# Patient Record
Sex: Male | Born: 1985 | Race: Black or African American | Hispanic: No | Marital: Single | Smoking: Current every day smoker
Health system: Southern US, Community
[De-identification: ages and names within clinical notes are randomized; demographics above are authoritative.]

---

## 2004-01-30 ENCOUNTER — Emergency Department: Payer: Self-pay | Admitting: Unknown Physician Specialty

## 2005-02-16 ENCOUNTER — Emergency Department (HOSPITAL_COMMUNITY): Admission: EM | Admit: 2005-02-16 | Discharge: 2005-02-16 | Payer: Self-pay | Admitting: Emergency Medicine

## 2005-03-02 ENCOUNTER — Emergency Department (HOSPITAL_COMMUNITY): Admission: EM | Admit: 2005-03-02 | Discharge: 2005-03-02 | Payer: Self-pay | Admitting: Emergency Medicine

## 2005-03-04 ENCOUNTER — Emergency Department (HOSPITAL_COMMUNITY): Admission: EM | Admit: 2005-03-04 | Discharge: 2005-03-04 | Payer: Self-pay | Admitting: Family Medicine

## 2009-10-03 ENCOUNTER — Emergency Department (HOSPITAL_COMMUNITY): Admission: EM | Admit: 2009-10-03 | Discharge: 2009-10-03 | Payer: Self-pay | Admitting: Emergency Medicine

## 2010-03-18 LAB — URINALYSIS, ROUTINE W REFLEX MICROSCOPIC
Bilirubin Urine: NEGATIVE
Ketones, ur: NEGATIVE mg/dL
Nitrite: NEGATIVE
Urobilinogen, UA: 1 mg/dL (ref 0.0–1.0)

## 2010-03-18 LAB — COMPREHENSIVE METABOLIC PANEL
AST: 25 U/L (ref 0–37)
Albumin: 3.6 g/dL (ref 3.5–5.2)
CO2: 25 mEq/L (ref 19–32)
Calcium: 8.9 mg/dL (ref 8.4–10.5)
Creatinine, Ser: 0.86 mg/dL (ref 0.4–1.5)
GFR calc Af Amer: >60 mL/min (ref >60)
GFR calc non Af Amer: >60 mL/min (ref >60)

## 2010-03-18 LAB — CBC
MCH: 28.3 pg (ref 26.0–34.0)
MCHC: 33.7 g/dL (ref 30.0–36.0)
Platelets: 282 10*3/uL (ref 150–400)

## 2010-03-18 LAB — LIPASE, BLOOD: Lipase: 26 U/L (ref 11–59)

## 2010-03-18 LAB — DIFFERENTIAL
Eosinophils Relative: 2 % (ref 0–5)
Lymphocytes Relative: 28 % (ref 12–46)
Lymphs Abs: 1.4 10*3/uL (ref 0.7–4.0)

## 2012-06-16 ENCOUNTER — Encounter (HOSPITAL_COMMUNITY): Payer: Self-pay | Admitting: *Deleted

## 2012-06-16 ENCOUNTER — Emergency Department (INDEPENDENT_AMBULATORY_CARE_PROVIDER_SITE_OTHER)
Admission: EM | Admit: 2012-06-16 | Discharge: 2012-06-16 | Disposition: A | Discharge status: Home / Self Care | Payer: Self-pay | Source: Home / Self Care | Attending: Emergency Medicine | Admitting: Emergency Medicine

## 2012-06-16 DIAGNOSIS — A691 Other Vincent's infections: Secondary | ICD-10-CM

## 2012-06-16 MED ORDER — METRONIDAZOLE 500 MG PO TABS: 500.0000 mg | ORAL_TABLET | Freq: Three times a day (TID) | ORAL | Status: DC

## 2012-06-16 MED ORDER — HYDROCODONE-ACETAMINOPHEN 5-325 MG PO TABS: ORAL_TABLET | ORAL | Status: DC

## 2012-06-16 MED ORDER — CHLORHEXIDINE GLUCONATE 0.12 % MT SOLN: OROMUCOSAL | Status: DC

## 2012-06-16 MED ORDER — PENICILLIN V POTASSIUM 500 MG PO TABS: 500.0000 mg | ORAL_TABLET | Freq: Three times a day (TID) | ORAL | Status: DC

## 2012-06-16 NOTE — ED Notes (Signed)
PT  REPORTS  SWELLING  R  SIDE  FACE  -  NEAR     R  LOWER  CHEEK  AREA   IN  PROXIMTY  TO  SALIVARY  GLANDS       DENYS  ANY  TOOTHACHE     DENYS  ANY  SORETHROAT OR PAIN ON  SWALLOWING -  AIRWAY  INTACT        IN NO ACUTE  DISTRESS

## 2012-06-16 NOTE — ED Provider Notes (Signed)
Chief Complaint:   Chief Complaint  Patient presents with  . Facial Swelling    History of Present Illness:   Richard Tate is a 27 year old male who has had a five-day history of fever and pain in the right side of the face, localized to the cheek with swelling of the cheek and he denies any dental pain, however he does have pain with chewing and biting. He denies any sore throat or difficulty breathing or swallowing. He's had no cervical lymphadenopathy but does have some pain in his neck. He's had no coughing, wheezing, chest pain, or shortness of breath. He denies any nasal congestion or rhinorrhea.  Review of Systems:  Other than noted above, the patient denies any of the following symptoms: Systemic:  No fevers, chills, sweats, weight loss or gain, fatigue, or tiredness. Eye:  No redness, pain, discharge, itching, blurred vision, or diplopia. ENT:  No headache, nasal congestion, sneezing, itching, epistaxis, ear pain, congestion, decreased hearing, ringing in ears, vertigo, or tinnitus.  No oral lesions, sore throat, pain on swallowing, or hoarseness. Neck:  No mass, tenderness or adenopathy. Lungs:  No coughing, wheezing, or shortness of breath. Skin:  No rash or itching.  PMFSH:  Past medical history, family history, social history, meds, and allergies were reviewed.   Physical Exam:   Vital signs:  BP 129/84  Pulse 90  Temp(Src) 98.2 F (36.8 C) (Oral)  Resp 18  SpO2 100% General:  Alert and oriented.  In no distress.  Skin warm and dry. Eye:  PERRL, full EOMs, lids and conjunctiva normal.   ENT:  TMs and canals clear.  Nasal mucosa not congested and without drainage.  Mucous membranes moist, he has widespread inflammation and swelling of the gingiva or purulent drainage coming from around the teeth. This involves both upper and lower dentition. He has multiple carious teeth and widespread dental decay. There is no swelling of the floor the mouth. The pharynx is clear the airway  is widely patent and he does have some swelling of the cheek but there is no swelling of the parotid gland, or parotid mass. There is no cervical lymphadenopathy. He does have a little tenderness in the anterior cervical triangle on the right. Neck:  Supple, full ROM.  No adenopathy, tenderness or mass.  Thyroid normal. Lungs:  Breath sounds clear and equal bilaterally.  No wheezes, rales or rhonchi. Heart:  Rhythm regular, without extrasystoles.  No gallops or murmers. Skin:  Clear, warm and dry.  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  He has severe gingivitis. He will need to see a dentist. I recommended good dental hygiene, and he was given prescriptions for Peridex, hydrocodone for the pain, Flagyl, and penicillin.  Plan:   1.  The following meds were prescribed:   Discharge Medication List as of 06/16/2012  2:51 PM    START taking these medications   Details  chlorhexidine (PERIDEX) 0.12 % solution 15 mL BID, swish and spit out, Normal    HYDROcodone-acetaminophen (NORCO/VICODIN) 5-325 MG per tablet 1 to 2 tabs every 4 to 6 hours as needed for pain., Print    metroNIDAZOLE (FLAGYL) 500 MG tablet Take 1 tablet (500 mg total) by mouth 3 (three) times daily., Starting 06/16/2012, Until Discontinued, Normal    penicillin v potassium (VEETID) 500 MG tablet Take 1 tablet (500 mg total) by mouth 3 (three) times daily., Starting 06/16/2012, Until Discontinued, Normal       2.  The patient was instructed  in symptomatic care and handouts were given. 3.  The patient was told to return if becoming worse in any way, if no better in 3 or 4 days, and given some red flag symptoms such as difficulty swallowing or breathing that would indicate earlier return. 4.  Follow up with our dentist on call today as soon as possible.    Reuben Likes, MD 06/16/12 [phone]

## 2012-10-29 ENCOUNTER — Emergency Department (HOSPITAL_COMMUNITY): Payer: Self-pay

## 2012-10-29 ENCOUNTER — Encounter (HOSPITAL_COMMUNITY): Payer: Self-pay | Admitting: Emergency Medicine

## 2012-10-29 ENCOUNTER — Emergency Department (HOSPITAL_COMMUNITY)
Admission: EM | Admit: 2012-10-29 | Discharge: 2012-10-29 | Disposition: A | Discharge status: Home / Self Care | Payer: Self-pay | Attending: Emergency Medicine | Admitting: Emergency Medicine

## 2012-10-29 DIAGNOSIS — F17200 Nicotine dependence, unspecified, uncomplicated: Secondary | ICD-10-CM | POA: Insufficient documentation

## 2012-10-29 DIAGNOSIS — S0181XA Laceration without foreign body of other part of head, initial encounter: Secondary | ICD-10-CM

## 2012-10-29 DIAGNOSIS — S0990XA Unspecified injury of head, initial encounter: Secondary | ICD-10-CM

## 2012-10-29 DIAGNOSIS — Z880 Allergy status to penicillin: Secondary | ICD-10-CM | POA: Insufficient documentation

## 2012-10-29 DIAGNOSIS — S0180XA Unspecified open wound of other part of head, initial encounter: Secondary | ICD-10-CM | POA: Insufficient documentation

## 2012-10-29 MED ORDER — TETANUS-DIPHTH-ACELL PERTUSSIS 5-2.5-18.5 LF-MCG/0.5 IM SUSP
0.5000 mL | Freq: Once | INTRAMUSCULAR | Status: AC
  Administered 2012-10-29: 0.5 mL via INTRAMUSCULAR
  Filled 2012-10-29: qty 0.5
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
## 2012-11-03 NOTE — ED Provider Notes (Signed)
Medical screening examination/treatment/procedure(s) were performed by non-physician practitioner and as supervising physician I was immediately available for consultation/collaboration.  EKG Interpretation   None         Lelan Cush J Swayze Kozuch, MD 11/03/12 1107 

## 2013-10-13 ENCOUNTER — Emergency Department (HOSPITAL_COMMUNITY)
Admission: EM | Admit: 2013-10-13 | Discharge: 2013-10-13 | Disposition: A | Discharge status: Home / Self Care | Payer: Self-pay | Attending: Emergency Medicine | Admitting: Emergency Medicine

## 2013-10-13 ENCOUNTER — Encounter (HOSPITAL_COMMUNITY): Payer: Self-pay | Admitting: Emergency Medicine

## 2013-10-13 ENCOUNTER — Emergency Department (HOSPITAL_COMMUNITY): Payer: Self-pay

## 2013-10-13 DIAGNOSIS — Z880 Allergy status to penicillin: Secondary | ICD-10-CM | POA: Insufficient documentation

## 2013-10-13 DIAGNOSIS — K0889 Other specified disorders of teeth and supporting structures: Secondary | ICD-10-CM

## 2013-10-13 DIAGNOSIS — K088 Other specified disorders of teeth and supporting structures: Secondary | ICD-10-CM | POA: Insufficient documentation

## 2013-10-13 DIAGNOSIS — K029 Dental caries, unspecified: Secondary | ICD-10-CM | POA: Insufficient documentation

## 2013-10-13 DIAGNOSIS — Z791 Long term (current) use of non-steroidal anti-inflammatories (NSAID): Secondary | ICD-10-CM | POA: Insufficient documentation

## 2013-10-13 DIAGNOSIS — Z792 Long term (current) use of antibiotics: Secondary | ICD-10-CM | POA: Insufficient documentation

## 2013-10-13 DIAGNOSIS — Z720 Tobacco use: Secondary | ICD-10-CM | POA: Insufficient documentation

## 2013-10-13 DIAGNOSIS — R220 Localized swelling, mass and lump, head: Secondary | ICD-10-CM

## 2013-10-13 LAB — CBC WITH DIFFERENTIAL/PLATELET
BASOS ABS: 0.1 10*3/uL (ref 0.0–0.1)
BASOS PCT: 0 % (ref 0–1)
EOS ABS: 0 10*3/uL (ref 0.0–0.7)
Eosinophils Relative: 0 % (ref 0–5)
HCT: 40.2 % (ref 39.0–52.0)
Hemoglobin: 13.9 g/dL (ref 13.0–17.0)
Lymphocytes Relative: 19 % (ref 12–46)
Lymphs Abs: 2.3 10*3/uL (ref 0.7–4.0)
MCH: 31.7 pg (ref 26.0–34.0)
MCHC: 34.6 g/dL (ref 30.0–36.0)
MCV: 91.8 fL (ref 78.0–100.0)
Monocytes Absolute: 1.1 10*3/uL — ABNORMAL HIGH (ref 0.1–1.0)
Monocytes Relative: 9 % (ref 3–12)
NEUTROS ABS: 8.9 10*3/uL — AB (ref 1.7–7.7)
NEUTROS PCT: 72 % (ref 43–77)
PLATELETS: 220 10*3/uL (ref 150–400)
RBC: 4.38 MIL/uL (ref 4.22–5.81)
RDW: 14.3 % (ref 11.5–15.5)
WBC: 12.4 10*3/uL — ABNORMAL HIGH (ref 4.0–10.5)

## 2013-10-13 LAB — I-STAT CHEM 8, ED
BUN: 4 mg/dL — ABNORMAL LOW (ref 6–23)
CREATININE: 0.9 mg/dL (ref 0.50–1.35)
Calcium, Ion: 1.1 mmol/L — ABNORMAL LOW (ref 1.12–1.23)
Chloride: 106 mEq/L (ref 96–112)
GLUCOSE: 68 mg/dL — AB (ref 70–99)
HEMATOCRIT: 46 % (ref 39.0–52.0)
HEMOGLOBIN: 15.6 g/dL (ref 13.0–17.0)
Potassium: 4.6 mEq/L (ref 3.7–5.3)
SODIUM: 142 meq/L (ref 137–147)
TCO2: 24 mmol/L (ref 0–100)

## 2013-10-13 MED ORDER — DEXAMETHASONE SODIUM PHOSPHATE 10 MG/ML IJ SOLN
10.0000 mg | Freq: Once | INTRAMUSCULAR | Status: AC
  Administered 2013-10-13: 10 mg via INTRAVENOUS
  Filled 2013-10-13: qty 1

## 2013-10-13 MED ORDER — IBUPROFEN 800 MG PO TABS: 800.0000 mg | ORAL_TABLET | Freq: Three times a day (TID) | ORAL | Status: DC

## 2013-10-13 MED ORDER — CLINDAMYCIN HCL 150 MG PO CAPS: 300.0000 mg | ORAL_CAPSULE | Freq: Three times a day (TID) | ORAL | Status: DC

## 2013-10-13 MED ORDER — CLINDAMYCIN PHOSPHATE 900 MG/50ML IV SOLN
900.0000 mg | Freq: Once | INTRAVENOUS | Status: AC
  Administered 2013-10-13: 900 mg via INTRAVENOUS
  Filled 2013-10-13: qty 50

## 2013-10-13 MED ORDER — MORPHINE SULFATE 4 MG/ML IJ SOLN
4.0000 mg | Freq: Once | INTRAMUSCULAR | Status: AC
  Administered 2013-10-13: 4 mg via INTRAVENOUS
  Filled 2013-10-13: qty 1

## 2013-10-13 MED ORDER — OXYCODONE-ACETAMINOPHEN 5-325 MG PO TABS: 1.0000 | ORAL_TABLET | ORAL | Status: DC | PRN

## 2013-10-13 NOTE — ED Provider Notes (Signed)
Medical screening examination/treatment/procedure(s) were performed by non-physician practitioner and as supervising physician I was immediately available for consultation/collaboration.   EKG Interpretation None        Gilda Creasehristopher J. Pollina, MD 10/13/13 1700

## 2013-10-13 NOTE — ED Provider Notes (Signed)
CSN: [id number]     Arrival date & time 10/13/13  1237 History   First MD Initiated Contact with Patient 10/13/13 1526     Chief Complaint  Patient presents with  . Dental Problem  . Facial Swelling     (Consider location/radiation/quality/duration/timing/severity/associated sxs/prior Treatment) HPI Comments: Patient is a 28 year old male presenting to the emergency department for right-sided facial swelling and right upper dental pain. Patient states it started several days ago he noticed some severe throbbing pain in the right upper tooth since then has had swelling. He denies any drainage from the tooth. He is attempted to take Motrin without any relief. He has attempted to follow up with a dentist but has been unable to get into see one yet. Denies any fevers, chills, nausea, vomiting, shortness of breath, difficulty swallowing, lip or tongue swelling, CP.    History reviewed. No pertinent past medical history. History reviewed. No pertinent past surgical history. No family history on file. History  Substance Use Topics  . Smoking status: Current Every Day Smoker  . Smokeless tobacco: Not on file  . Alcohol Use: Yes    Review of Systems  HENT: Positive for dental problem and facial swelling.   Respiratory: Negative for shortness of breath.   Cardiovascular: Negative for chest pain.  All other systems reviewed and are negative.     Allergies  Penicillins  Home Medications   Prior to Admission medications   Medication Sig Start Date End Date Taking? Authorizing Provider  clindamycin (CLEOCIN) 150 MG capsule Take 2 capsules (300 mg total) by mouth 3 (three) times daily. May dispense as 150mg  capsules 10/13/13   Khalessi Blough L Elva Mauro, PA-C  ibuprofen (ADVIL,MOTRIN) 800 MG tablet Take 1 tablet (800 mg total) by mouth 3 (three) times daily. 10/13/13   Brittannie Tawney L Kale Dols, PA-C  oxyCODONE-acetaminophen (PERCOCET/ROXICET) 5-325 MG per tablet Take 1 tablet by mouth every 4  (four) hours as needed for severe pain. May take 2 tablets PO q 6 hours for severe pain - Do not take with Tylenol as this tablet already contains tylenol 10/13/13   Marrissa Dai L Batsheva Stevick, PA-C   BP 131/79  Pulse 102  Resp 16  Ht 6\' 1"  (1.854 m)  Wt 153 lb (69.4 kg)  BMI 20.19 kg/m2  SpO2 98% Physical Exam  Nursing note and vitals reviewed. Constitutional: He is oriented to person, place, and time. He appears well-developed and well-nourished. No distress.  HENT:  Head: Normocephalic and atraumatic.    Right Ear: External ear normal.  Left Ear: External ear normal.  Nose: Nose normal.  Mouth/Throat: Uvula is midline and mucous membranes are normal. No trismus in the jaw. Abnormal dentition. Dental caries present. No dental abscesses or uvula swelling. No oropharyngeal exudate.    Submental and sublingual spaces are soft.  Eyes: Conjunctivae are normal.  Neck: Normal range of motion. Neck supple.  Cardiovascular: Normal rate, regular rhythm and normal heart sounds.   Pulmonary/Chest: Effort normal and breath sounds normal. No respiratory distress.  Lymphadenopathy:    He has no cervical adenopathy.  Neurological: He is alert and oriented to person, place, and time.  Skin: Skin is warm and dry. He is not diaphoretic.  Psychiatric: He has a normal mood and affect.    ED Course  Procedures (including critical care time) Medications  clindamycin (CLEOCIN) IVPB 900 mg (900 mg Intravenous New Bag/Given 10/13/13 1605)  morphine 4 MG/ML injection 4 mg (4 mg Intravenous Given 10/13/13 1604)  dexamethasone (DECADRON) injection 10  mg (10 mg Intravenous Given 10/13/13 1604)    Labs Review Labs Reviewed  CBC WITH DIFFERENTIAL - Abnormal; Notable for the following:    WBC 12.4 (*)    Neutro Abs 8.9 (*)    Monocytes Absolute 1.1 (*)    All other components within normal limits  I-STAT CHEM 8, ED - Abnormal; Notable for the following:    BUN 4 (*)    Glucose, Bld 68 (*)     Calcium, Ion 1.10 (*)    All other components within normal limits    Imaging Review No results found.   EKG Interpretation None      MDM   Final diagnoses:  Right facial swelling  Pain, dental    Filed Vitals:   10/13/13 1251  BP: 131/79  Pulse: 102  Resp: 16   Afebrile, NAD, non-toxic appearing, AAOx4. Patient will receive a CT scan for better evaluation of facial swelling as there is no obvious dental abscess on examination. No concern for Ludwig's angina. Labs reviewed. IV Clindamycin given. Patient signed out to Sharilyn SitesLisa Sanders, PA-C pending CT maxillofacial. Disposition pending lab results, if no drain able abscess or other complication anticipate discharge home with dental follow up. Patient d/w with Dr. Blinda LeatherwoodPollina, agrees with plan.     Jeannetta EllisJennifer L Neosha Switalski, PA-C 10/13/13 1654

## 2013-10-13 NOTE — Discharge Instructions (Signed)
Please follow up with your primary care physician in 1-2 days. If you do not have one please call the [facility] number listed above. Please follow up with Dr. Lucky CowboyKnox to schedule a follow up appointment.  Please take your antibiotic until completion. Please take pain medication and/or muscle relaxants as prescribed and as needed for pain. Please do not drive on narcotic pain medication or on muscle relaxants. Please read all discharge instructions and return precautions.   Dental Abscess A dental abscess is a collection of infected fluid (pus) from a bacterial infection in the inner part of the tooth (pulp). It usually occurs at the end of the tooth's root.  CAUSES   Severe tooth decay.  Trauma to the tooth that allows bacteria to enter into the pulp, such as a broken or chipped tooth. SYMPTOMS   Severe pain in and around the infected tooth.  Swelling and redness around the abscessed tooth or in the mouth or face.  Tenderness.  Pus drainage.  Bad breath.  Bitter taste in the mouth.  Difficulty swallowing.  Difficulty opening the mouth.  Nausea.  Vomiting.  Chills.  Swollen neck glands. DIAGNOSIS   A medical and dental history will be taken.  An examination will be performed by tapping on the abscessed tooth.  X-rays may be taken of the tooth to identify the abscess. TREATMENT The goal of treatment is to eliminate the infection. You may be prescribed antibiotic medicine to stop the infection from spreading. A root canal may be performed to save the tooth. If the tooth cannot be saved, it may be pulled (extracted) and the abscess may be drained.  HOME CARE INSTRUCTIONS  Only take over-the-counter or prescription medicines for pain, fever, or discomfort as directed by your caregiver.  Rinse your mouth (gargle) often with salt water ( tsp salt in 8 oz [250 ml] of warm water) to relieve pain or swelling.  Do not drive after taking pain medicine  (narcotics).  Do not apply heat to the outside of your face.  Return to your dentist for further treatment as directed. SEEK MEDICAL CARE IF:  Your pain is not helped by medicine.  Your pain is getting worse instead of better. SEEK IMMEDIATE MEDICAL CARE IF:  You have a fever or persistent symptoms for more than 2-3 days.  You have a fever and your symptoms suddenly get worse.  You have chills or a very bad headache.  You have problems breathing or swallowing.  You have trouble opening your mouth.  You have swelling in the neck or around the eye. Document Released: 12/20/2004 Document Revised: 09/14/2011 Document Reviewed: 03/30/2010 [facility] Patient Information 2015 [employer] [place]. This information is not intended to replace advice given to you by your health care provider. Make sure you discuss any questions you have with your health care provider.  RESOURCE GUIDE  If you do not have a primary care doctor to follow up with regarding today's visit, please call the [facility] at [phone] to make an appointment. Hours of operation are 10am - 7pm, Monday through Friday, and they have a sliding scale fee.    Dental Assistance Please contact the on-call dentist listed on your discharge papers WITHIN 48 HOURS.  If the on-call dentist agrees that your condition is emergent, there is a CHANCE (not a guarantee) that you MAY receive a savings on your visit at this point. If you wait more than 48 hours, it will not be  considered an emergency.   Drs. Moreen Fowleravid and Janna Civils:  [address], [place], [place], [address], [phone]  Short-notice availability  Tooth evaluation: $100  Emergency Treatment: $200 (including exam, xrays, tooth extraction, and post-op visit)  Patients with Medicaid: [facility] [phone], [phone] [address], [phone]  If unable to pay, or uninsured, contact [facility] [phone] in [place], [phone] in [place]) to become qualified for the adult dental clinic  Other Low-Cost Community Dental Services: - [facility]: [address], [place], [place], [address], [phone], [address]. [phone], 2nd and 4th Thursday of the month at 6:30am.  10 clients each day by appointment, can sometimes see walk-in patients if someone does not show for an appointment. [facility]:  [address], [place], [place], [address], [phone] [facility]:  [address], [place], [place], [address], [phone] [facility]:  [phone] [facility]:  [phone] [facility]:  [phone]

## 2013-10-13 NOTE — ED Notes (Signed)
Pt states he started having pain to a tooth on the upper right side a few days ago and had some swelling but it is much worse now.

## 2013-10-13 NOTE — ED Provider Notes (Signed)
Patient received in sign out from PA Piepenbrink at shift change.  28 y.o. M with suspected dental abscess, no drainable fluid collected noted on exam.  Patient continues handling secretions wells, talking without difficulty, and airway remains clear, however due to amount of swelling imaging will be obtained.  Plan:  Labs and CT max/face with contrast pending.  Likely discharge with clindamycin and pain meds as written by PA Piepenbrink.  Results for orders placed during the hospital encounter of 10/13/13  CBC WITH DIFFERENTIAL      Result Value Ref Range   WBC 12.4 (*) 4.0 - 10.5 K/uL   RBC 4.38  4.22 - 5.81 MIL/uL   Hemoglobin 13.9  13.0 - 17.0 g/dL   HCT 69.640.2  29.539.0 - 28.452.0 %   MCV 91.8  78.0 - 100.0 fL   MCH 31.7  26.0 - 34.0 pg   MCHC 34.6  30.0 - 36.0 g/dL   RDW 13.214.3  44.011.5 - 10.215.5 %   Platelets 220  150 - 400 K/uL   Neutrophils Relative % 72  43 - 77 %   Neutro Abs 8.9 (*) 1.7 - 7.7 K/uL   Lymphocytes Relative 19  12 - 46 %   Lymphs Abs 2.3  0.7 - 4.0 K/uL   Monocytes Relative 9  3 - 12 %   Monocytes Absolute 1.1 (*) 0.1 - 1.0 K/uL   Eosinophils Relative 0  0 - 5 %   Eosinophils Absolute 0.0  0.0 - 0.7 K/uL   Basophils Relative 0  0 - 1 %   Basophils Absolute 0.1  0.0 - 0.1 K/uL  I-STAT CHEM 8, ED      Result Value Ref Range   Sodium 142  137 - 147 mEq/L   Potassium 4.6  3.7 - 5.3 mEq/L   Chloride 106  96 - 112 mEq/L   BUN 4 (*) 6 - 23 mg/dL   Creatinine, Ser 7.250.90  0.50 - 1.35 mg/dL   Glucose, Bld 68 (*) 70 - 99 mg/dL   Calcium, Ion 3.661.10 (*) 1.12 - 1.23 mmol/L   TCO2 24  0 - 100 mmol/L   Hemoglobin 15.6  13.0 - 17.0 g/dL   HCT 44.046.0  34.739.0 - 42.552.0 %   Ct Maxillofacial Wo Cm  10/13/2013   CLINICAL DATA:  Facial pain and swelling which began a few days ago.  EXAM: CT MAXILLOFACIAL WITHOUT CONTRAST  TECHNIQUE: Multidetector CT imaging of the maxillofacial structures was performed without intravenous contrast. Multiplanar CT image reconstructions were also generated. A small  metallic BB was placed on the right temple in order to reliably differentiate right from left.  : COMPARISON:  Head CT 10/29/2012  FINDINGS: There is extensive dental disease with bilateral dental caries. The second left mandibular premolar tooth demonstrates periapical lucency and destructive bony changes. The lateral margin of the mandible is disrupted. The maxillary teeth demonstrate similar findings. On the left side there is a large cavity involving the first molar and significant periapical lucency and destructive bony change involving the first premolar tooth. On the right side there is a large cavity involving the second premolar tooth along with periapical lucency. There is also significant loss of bone between the second premolar and first molar tooth.  Although above study was done without contrast there appears to be a small abscess adjacent to the right mandible. This measures approximately 2.6 x 0.9 cm. There is also extensive subcutaneous soft tissue swelling/ edema involving the entire right side of  the face.  Extensive paranasal sinus disease with mucoperiosteal thickening involving both maxillary sinuses and scattered ethmoid air cells. The sphenoid sinus is clear and the mastoid air cells and middle ear cavities are clear. The frontal sinuses demonstrate minimal mucoperiosteal thickening. The globes are intact. The visualized portion of the brain is unremarkable.  IMPRESSION: Extensive dental disease with dental caries and periapical lucency. Suspect a small abscess on the right side adjacent to the mandible with extensive right-sided facial cellulitis.   Electronically Signed   By: Loralie ChampagneMark  Gallerani M.D.   On: 10/13/2013 18:01   Lab with leukocytosis, no significant electrolyte imbalance.  CT revealing dental abscess with overlying facial cellulitis.  On repeat evaluation, no drainable fluid collection.  Airway remains clear.  Patient will be discharged home with abx and pain meds as per PA  Piepenbrink.  FU with dentist.  Discussed plan with patient, he/she acknowledged understanding and agreed with plan of care.  Return precautions given for new or worsening symptoms.  Garlon HatchetLisa M Sanders, PA-C 10/13/13 2027

## 2013-10-13 NOTE — ED Notes (Signed)
Pt reports facial swelling and tooth pain that has been getting worse over 4 days. Pt states he has a tooth that has a chunk missing. Visible swelling to rt side of face. Pt denies any trouble breathing. Rates pain 9/10.

## 2013-10-14 NOTE — ED Provider Notes (Signed)
Medical screening examination/treatment/procedure(s) were performed by non-physician practitioner and as supervising physician I was immediately available for consultation/collaboration.   EKG Interpretation None       Doug SouSam Mekiah Cambridge, MD 10/14/13 [phone]

## 2014-01-09 ENCOUNTER — Emergency Department (HOSPITAL_COMMUNITY)
Admission: EM | Admit: 2014-01-09 | Discharge: 2014-01-09 | Disposition: A | Discharge status: Home / Self Care | Payer: Self-pay | Attending: Emergency Medicine | Admitting: Emergency Medicine

## 2014-01-09 ENCOUNTER — Encounter (HOSPITAL_COMMUNITY): Payer: Self-pay | Admitting: Cardiology

## 2014-01-09 DIAGNOSIS — Z720 Tobacco use: Secondary | ICD-10-CM | POA: Insufficient documentation

## 2014-01-09 DIAGNOSIS — N390 Urinary tract infection, site not specified: Secondary | ICD-10-CM | POA: Insufficient documentation

## 2014-01-09 DIAGNOSIS — Z880 Allergy status to penicillin: Secondary | ICD-10-CM | POA: Insufficient documentation

## 2014-01-09 DIAGNOSIS — Z792 Long term (current) use of antibiotics: Secondary | ICD-10-CM | POA: Insufficient documentation

## 2014-01-09 DIAGNOSIS — Z791 Long term (current) use of non-steroidal anti-inflammatories (NSAID): Secondary | ICD-10-CM | POA: Insufficient documentation

## 2014-01-09 LAB — URINALYSIS, ROUTINE W REFLEX MICROSCOPIC
Glucose, UA: NEGATIVE mg/dL
Ketones, ur: 15 mg/dL — AB
LEUKOCYTES UA: NEGATIVE
NITRITE: POSITIVE — AB
PH: 7 (ref 5.0–8.0)
Protein, ur: >300 mg/dL — AB
SPECIFIC GRAVITY, URINE: 1.025 (ref 1.005–1.030)
UROBILINOGEN UA: 4 mg/dL — AB (ref 0.0–1.0)

## 2014-01-09 LAB — URINE MICROSCOPIC-ADD ON

## 2014-01-09 MED ORDER — PHENAZOPYRIDINE HCL 200 MG PO TABS
200.0000 mg | ORAL_TABLET | Freq: Three times a day (TID) | ORAL | Status: AC
Start: 2014-01-09 — End: ?

## 2014-01-09 MED ORDER — CEPHALEXIN 500 MG PO CAPS: 500.0000 mg | ORAL_CAPSULE | Freq: Four times a day (QID) | ORAL | Status: DC

## 2014-01-09 MED ORDER — SODIUM CHLORIDE 0.9 % IV BOLUS (SEPSIS): 1000.0000 mL | Freq: Once | INTRAVENOUS | Status: DC

## 2014-01-09 MED ORDER — PHENAZOPYRIDINE HCL 100 MG PO TABS
200.0000 mg | ORAL_TABLET | Freq: Once | ORAL | Status: AC
  Administered 2014-01-09: 200 mg via ORAL
  Filled 2014-01-09: qty 2

## 2014-01-09 MED ORDER — CEPHALEXIN 250 MG PO CAPS
500.0000 mg | ORAL_CAPSULE | Freq: Once | ORAL | Status: AC
  Administered 2014-01-09: 500 mg via ORAL
  Filled 2014-01-09: qty 2

## 2014-01-09 NOTE — ED Provider Notes (Signed)
CSN: [id number]     Arrival date & time 01/09/14  1005 History   First MD Initiated Contact with Patient 01/09/14 1045     Chief Complaint  Patient presents with  . Dysuria   (Consider location/radiation/quality/duration/timing/severity/associated sxs/prior Treatment) HPI Richard Tate is a 29 yo male presenting with dysuria x 2 days.  He reports he has burning in his poenis when he urinates and then suprapubic pain afterward. He states he has been avoiding drinking because of the pain he has when he urinates, so he feels thirsty.  He reports some mild discomfort in his back bilat but denies any nausea, vomiting, fever, chills, penile discharge or scrotal pain.       History reviewed. No pertinent past medical history. History reviewed. No pertinent past surgical history. History reviewed. No pertinent family history. History  Substance Use Topics  . Smoking status: Current Every Day Smoker  . Smokeless tobacco: Not on file  . Alcohol Use: Yes    Review of Systems  Constitutional: Negative for fever and chills.  HENT: Negative for sore throat.   Eyes: Negative for visual disturbance.  Respiratory: Negative for cough and shortness of breath.   Cardiovascular: Negative for chest pain and leg swelling.  Gastrointestinal: Positive for abdominal pain. Negative for nausea, vomiting and diarrhea.  Genitourinary: Positive for dysuria. Negative for discharge, penile swelling, scrotal swelling and testicular pain.  Musculoskeletal: Negative for myalgias.  Skin: Negative for rash.  Neurological: Negative for weakness, numbness and headaches.    Allergies  Penicillins  Home Medications   Prior to Admission medications   Medication Sig Start Date End Date Taking? Authorizing Provider  clindamycin (CLEOCIN) 150 MG capsule Take 2 capsules (300 mg total) by mouth 3 (three) times daily. May dispense as  capsules 10/13/13   Jennifer L Piepenbrink, PA-C  ibuprofen (ADVIL,MOTRIN) 800 MG  tablet Take 1 tablet (800 mg total) by mouth 3 (three) times daily. 10/13/13   Jennifer L Piepenbrink, PA-C  oxyCODONE-acetaminophen (PERCOCET/ROXICET) 5-325 MG per tablet Take 1 tablet by mouth every 4 (four) hours as needed for severe pain. May take 2 tablets PO q 6 hours for severe pain - Do not take with Tylenol as this tablet already contains tylenol 10/13/13   Jennifer L Piepenbrink, PA-C   BP 136/81 mmHg  Pulse 78  Temp(Src) 97.9 F (36.6 C) (Oral)  Resp 18  Ht  (1.854 m)  Wt 160 lb (72.576 kg)  BMI 21.11 kg/m2  SpO2 100% Physical Exam  Constitutional: He appears well-developed and well-nourished. No distress.  HENT:  Head: Normocephalic and atraumatic.  Mouth/Throat: Oropharynx is clear and moist. No oropharyngeal exudate.  Eyes: Conjunctivae are normal.  Neck: Neck supple. No thyromegaly present.  Cardiovascular: Normal rate, regular rhythm and intact distal pulses.   Pulmonary/Chest: Effort normal and breath sounds normal. No respiratory distress.  Abdominal: Soft. There is tenderness in the suprapubic area. There is no CVA tenderness.  Genitourinary: Testes normal.  Musculoskeletal: He exhibits no tenderness.  Lymphadenopathy:    He has no cervical adenopathy.  Neurological: He is alert.  Skin: Skin is warm and dry. No rash noted. He is not diaphoretic.  Psychiatric: He has a normal mood and affect.  Nursing note and vitals reviewed.   ED Course  Procedures (including critical care time) Labs Review Labs Reviewed  URINALYSIS, ROUTINE W REFLEX MICROSCOPIC - Abnormal; Notable for the following:    Color, Urine AMBER (*)    APPearance CLOUDY (*)  Hgb urine dipstick LARGE (*)    Bilirubin Urine MODERATE (*)    Ketones, ur 15 (*)    Protein, ur >300 (*)    Urobilinogen, UA 4.0 (*)    Nitrite POSITIVE (*)    All other components within normal limits  URINE MICROSCOPIC-ADD ON - Abnormal; Notable for the following:    Bacteria, UA FEW (*)    All other  components within normal limits  URINE CULTURE    Imaging Review No results found.   EKG Interpretation None      MDM   Final diagnoses:  UTI (lower urinary tract infection)   29 yo with dysuria and suprapubic pain. His pain is minimal except on urination. He has no CVA tenderness or testicular pain and denies penile discharge.  I feel these findings are most consistent with a urinary tract infection.  He is afebrile, normotensive, and denies N/V. He had been avoiding oral intake at home due to pain with urination, discussed with pt the importance of water hydration and pt able to drink well in the ED. Pt to be dc home with antibiotics and instructions to follow up here or with a PCP if symptoms persist.   Filed Vitals:   01/09/14 1011 01/09/14 1051 01/09/14 1115 01/09/14 1130  BP: 136/81  124/88 128/91  Pulse: 78  70 70  Temp: 97.9 F (36.6 C)     TempSrc: Oral     Resp: 18     Height: 6\' 1"  (1.854 m)     Weight: 160 lb (72.576 kg)     SpO2: 100% 100% 100% 98%   Meds given in ED:  Medications  phenazopyridine (PYRIDIUM) tablet 200 mg (200 mg Oral Given 01/09/14 1107)  cephALEXin (KEFLEX) capsule 500 mg (500 mg Oral Given 01/09/14 1145)    Discharge Medication List as of 01/09/2014 11:31 AM    START taking these medications   Details  cephALEXin (KEFLEX) 500 MG capsule Take 1 capsule (500 mg total) by mouth 4 (four) times daily., Starting 01/09/2014, Until Discontinued, Print    phenazopyridine (PYRIDIUM) 200 MG tablet Take 1 tablet (200 mg total) by mouth 3 (three) times daily., Starting 01/09/2014, Until Discontinued, Print          Harle BattiestElizabeth Shiri Hodapp, NP 01/11/14 [id number]  Gilda Creasehristopher J. Pollina, MD 01/12/14 [phone]

## 2014-01-09 NOTE — ED Notes (Signed)
NP at bedside.

## 2014-01-09 NOTE — ED Notes (Signed)
Pt reports bladder pain and painful urination that started last night.

## 2014-01-09 NOTE — Discharge Instructions (Signed)
Please follow the directions provided.  Use the resource guide to establish care with a primary care provider to ensure you are getting better.  Please take your antibiotic as directed.  Use the Pyridium for pain.  If you have any worsening pain, chills, nausea, difficulty urinating or any other concerning symptom, don't hesitate to return to the emergency department.     SEEK IMMEDIATE MEDICAL CARE IF:  You have severe back pain or lower abdominal pain.  You develop chills.  You have nausea or vomiting.  You have continued burning or discomfort with urination.    Emergency Department Resource Guide 1) Find a Doctor and Pay Out of Pocket Although you won't have to find out who is covered by your insurance plan, it is a good idea to ask around and get recommendations. You will then need to call the office and see if the doctor you have chosen will accept you as a new patient and what types of options they offer for patients who are self-pay. Some doctors offer discounts or will set up payment plans for their patients who do not have insurance, but you will need to ask so you aren't surprised when you get to your appointment.  2) Contact Your Local Health Department Not all health departments have doctors that can see patients for sick visits, but many do, so it is worth a call to see if yours does. If you don't know where your local health department is, you can check in your phone book. The CDC also has a tool to help you locate your state's health department, and many state websites also have listings of all of their local health departments.  3) Find a Walk-in Clinic If your illness is not likely to be very severe or complicated, you may want to try a walk in clinic. These are popping up all over the country in pharmacies, drugstores, and shopping centers. They're usually staffed by nurse practitioners or physician assistants that have been trained to treat common illnesses and complaints.  They're usually fairly quick and inexpensive. However, if you have serious medical issues or chronic medical problems, these are probably not your best option.  No Primary Care Doctor: - Call Health Connect at  [phone] - they can help you locate a primary care doctor that  accepts your insurance, provides certain services, etc. - Physician Referral Service- [phone]  Chronic Pain Problems: Organization         Address  Phone   Notes  [facility]  [phone] Patients need to be referred by their primary care [phone].   Medication Assistance: Organization         Address  Phone   Notes  [facility] [address] [place], [place] [address] [phone] --Must be a resident of [place] -- Must have NO insurance coverage whatsoever (no Medicaid/ Medicare, etc.) -- The pt. MUST have a primary care doctor that directs their care regularly and follows them in the community   MedAssist  [phone]   [employer]  [phone]) [phone]    Agencies that provide inexpensive medical care: Organization         Address  Phone   Notes  [facility]  [phone]   [facility]    [phone]   [facility] [address] [place], [place] [address] [phone]) [phone]   [facility] [address] [place] [phone]   [facility]anned Parenthood    [phone]   [facility]    [phone]   [facility] and [facility]  [address], [place] Phone:  [phone], Fax:  [phone] Hours of Operation:  9 am - 6 pm, M-F.  Also accepts Medicaid/Medicare and self-pay.  [facility]  [address], [place] Phone: [phone], Fax: [phone]. Hours of Operation:  8:30 am - 5:30 pm, M-F.  Also accepts Medicaid and self-pay.  [facility] [place] [address], [place]  Phone: [phone]   [facility] [address] Natasha Bence [place], [place] [phone], Ext. [phone] Mondays & Thursdays: 7-9 AM.  First 15 patients are seen on a first come, first serve basis.    Medicaid-accepting [facility] Providers:  Organization         Address  Phone   Notes  [facility] [address], [place] [phone] Also accepts self-pay patients.  [facility] [address] Laurell Josephs [place], [place]  [phone]   [facility] [address], [place] [phone]   [facility] [address], [place] [phone]   Renaye Rakers [address], [place]   [phone] Only accepts [place] patients after they have their name applied to their card.   Self-Pay (no insurance) in [facility]:  Organization         Address  Phone   Notes  Sickle Cell Patients, [facility] [address] [place], [place] [phone]   [facility] [address] [place], [place] [phone]   [facility]  [address], [place] [phone]   [facility]/Dr. Osei-Bonsu  [address], [place] or [address], [place] [phone] Phone number for both [place] and [place] locations is the same.  [facility] [place] [address], [place] [phone]   [facility] [address], [place] or [address] [phone]   [facility] [address] [place] [phone], phone; [phone], fax Sees patients 1st and 3rd Saturday of every month.  Must not qualify for public or private insurance (i.e. Medicaid, Medicare, [place] Health Choice, Veterans' Benefits)  Household income should be no more than 200% of the poverty level The clinic cannot  treat you if you are pregnant or think you are pregnant  Sexually transmitted diseases are not treated at the clinic.    Dental Care: Organization         Address  Phone  Notes  [facility] [address] [place], [place] [phone] Accepts children up to age 70 who are enrolled in [place] or [place] Health Choice; pregnant women with a Medicaid card; and children who have applied for Medicaid or [place] Health Choice, but were declined, whose parents can pay a reduced fee at time of service.  [facility] Department of [facility]  [address], [place] [phone] Accepts children up to age 42 who are enrolled in [place] or [place] Health Choice; pregnant women with a Medicaid card; and children who have applied for Medicaid  or [place] Health Choice, but were declined, whose parents can pay a reduced fee at time of service.  [facility]  [address] [place], [place] [phone] Patients are seen by appointment only. Walk-ins are not accepted. [facility] will see patients 5 years of age and older. Monday - Tuesday (8am-5pm) Most Wednesdays (8:30-5pm) $30 per visit, cash only  [facility] Access [facility]  [address], [facility] [phone] Patients are seen by appointment only. Walk-ins are not accepted. [facility] will see patients 76 years of age and older. One Wednesday Evening (Monthly: Volunteer Based).  $30 per visit, cash only  [employer]  [phone] for adults; Children under age 84, call Graduate Pediatric Dentistry at [phone]. Children aged 17-14, please call [phone] to request a pediatric application.  Dental services are provided in all areas of dental care including fillings, crowns and bridges, complete and partial dentures, implants, gum treatment, root canals, and extractions. Preventive care is also provided.  Treatment is provided to both adults and children. Patients are selected via a lottery and there is often a waiting list.   [facility] [address] [place]  [phone] [email or web address]   [facility] [address] [place], [place] [phone], Ext. [phone] Second and Fourth Thursday of each month, opens at 6:30 AM; Clinic ends at 9 AM.  Patients are seen on a first-come first-served basis, and a limited number are seen during each clinic.   [facility]  [address] Ether Griffins [place], [place] [phone]   Eligibility Requirements You must have lived in [place], [place], or [place] counties for at least the last three months.   You cannot be eligible for state or federal sponsored [place], including [employer], [place], or [employer].   You generally cannot be eligible for healthcare insurance through your employer.    How to apply: Eligibility screenings are held every Tuesday and Wednesday afternoon from 1:00 pm until 4:00 pm. You do not need an appointment for the interview!  [facility] [address] [place], [place] [phone]   [facility] Health Department  [phone]   [facility] Health Department  [phone]   [facility] Health Department  [phone]    Behavioral Health Resources in the Community: Intensive Outpatient Programs Organization         Address  Phone  Notes  [facility] [address], [place], [place] [phone]   [facility]patient [address], [place], [place] [phone]   [facility] [address] [place], [place]  [phone]   [facility] Mental Health [address],  [place], [place] [phone] or [phone]   Substance Abuse Resources Organization         Address  Phone  Notes  [facility] Drug [facility]  [phone]   [facility]   [phone]   [facility]  [phone]   Floydene Flock  [phone]   Residential & Outpatient Substance Abuse Program  [phone]   Psychological Services Organization         Address  Phone  Notes  [facility] Behavioral Health  [phone]   [facility] Services  [phone]   [facility] Mental Health [address], [place] [phone] or [phone]    Mobile Crisis Teams Organization         Address  Phone  Notes  [facility], Mobile Crisis Care Unit  [phone]   [facility]  [address] [place], [place] [phone]   [facility] [address] [place] [phone]    Self-Help/Support Groups Organization         Address  Phone             Notes  [facility] [place] - variety of support groups  [phone]- I7437963 Call for more information  Narcotics Anonymous (NA), [facility] [address], [employer] [place]  2 meetings at this location   Statistician         Address  Phone  Notes  [facility] [address],    [place]  [phone]   [facility]  [address], [place] [address], [place], [place] [phone]   [facility] [address] [place], [place] [phone] Admissions: 8am-3pm M-F  [facility] [address],    [place], [place] [phone]   [facility] [address] [place], [place], [place] [phone]   [facility] [address]  [place], [place] [phone]   [facility] - Intensive Outpatient [address] Laurell Josephs [address], [place], [place] [phone]   [facility] ([facility]iction [facility] [address]  [place], [place] [phone] or [phone]) [phone]   [facility] ([facility]) [address] [place], [place] [phone] Accepts Medicaid  [facility] [address]  [place] [phone] Substance  Abuse/Addiction Treatment   [facility] Organization         Address  Phone  Notes  [facility]  [phone]   Angie Fava, PhD [address] Ervin Knack [place], [place]   [phone] or [phone]   [facility]   [address] [place], [place] [phone]   [facility] [address], [place], [place] [phone] Insurance/Medicaid/sponsorship through [employer] and Families [address]                                    [place], [place] [phone] Therapy/tele-psych/case  [facility] [address], [place] [phone]    Dr. Lolly Mustache  [phone]   [facility]  United Way [facility] Dept. 1) [address] [place] 2) [address] [place] 3)  [address], [place] [phone]  [phone]   [facility] Child Abuse Hotline 414-229-1708 or 817-001-1393 (After Hours)

## 2014-01-09 NOTE — ED Notes (Signed)
Patient giving oral fluids and encouraged to drink

## 2014-01-10 LAB — URINE CULTURE
Colony Count: 2000
Special Requests: NORMAL

## 2014-01-17 ENCOUNTER — Encounter (HOSPITAL_COMMUNITY): Payer: Self-pay | Admitting: Emergency Medicine

## 2014-01-17 ENCOUNTER — Emergency Department (HOSPITAL_COMMUNITY)
Admission: EM | Admit: 2014-01-17 | Discharge: 2014-01-17 | Disposition: A | Discharge status: Home / Self Care | Payer: Self-pay | Attending: Emergency Medicine | Admitting: Emergency Medicine

## 2014-01-17 DIAGNOSIS — Z791 Long term (current) use of non-steroidal anti-inflammatories (NSAID): Secondary | ICD-10-CM | POA: Insufficient documentation

## 2014-01-17 DIAGNOSIS — Z792 Long term (current) use of antibiotics: Secondary | ICD-10-CM | POA: Insufficient documentation

## 2014-01-17 DIAGNOSIS — R319 Hematuria, unspecified: Secondary | ICD-10-CM | POA: Insufficient documentation

## 2014-01-17 DIAGNOSIS — Z880 Allergy status to penicillin: Secondary | ICD-10-CM | POA: Insufficient documentation

## 2014-01-17 DIAGNOSIS — Z79899 Other long term (current) drug therapy: Secondary | ICD-10-CM | POA: Insufficient documentation

## 2014-01-17 DIAGNOSIS — Z720 Tobacco use: Secondary | ICD-10-CM | POA: Insufficient documentation

## 2014-01-17 LAB — URINALYSIS, ROUTINE W REFLEX MICROSCOPIC
Bilirubin Urine: NEGATIVE
Glucose, UA: NEGATIVE mg/dL
Ketones, ur: NEGATIVE mg/dL
Leukocytes, UA: NEGATIVE
NITRITE: NEGATIVE
PH: 6.5 (ref 5.0–8.0)
Protein, ur: NEGATIVE mg/dL
Specific Gravity, Urine: 1.014 (ref 1.005–1.030)
Urobilinogen, UA: 1 mg/dL (ref 0.0–1.0)

## 2014-01-17 LAB — URINE MICROSCOPIC-ADD ON

## 2014-01-17 NOTE — ED Notes (Signed)
Seen a week ago for UTI.  States i finished my antibiotic on Tuesday.  Starting having the same symptoms again yesterday.

## 2014-01-17 NOTE — Discharge Instructions (Signed)
Follow up with the recommended doctor as scheduled. Refer to attached documents for more information. Return to attached documents for more information.

## 2014-01-17 NOTE — ED Provider Notes (Signed)
CSN: [id number]     Arrival date & time 01/17/14  1856 History   First MD Initiated Contact with Patient 01/17/14 2005     Chief Complaint  Patient presents with  . Urinary Tract Infection     (Consider location/radiation/quality/duration/timing/severity/associated sxs/prior Treatment) Patient is a 29 y.o. male presenting with dysuria. The history is provided by the patient.  Dysuria This is a new problem. The current episode started in the past 7 days. The problem occurs constantly. Pertinent negatives include no abdominal pain, arthralgias, chest pain, chills, fatigue, fever, nausea, neck pain, vomiting or weakness. Nothing aggravates the symptoms. He has tried nothing for the symptoms. The treatment provided no relief.    History reviewed. No pertinent past medical history. History reviewed. No pertinent past surgical history. No family history on file. History  Substance Use Topics  . Smoking status: Current Every Day Smoker  . Smokeless tobacco: Not on file  . Alcohol Use: Yes    Review of Systems  Constitutional: Negative for fever, chills and fatigue.  HENT: Negative for trouble swallowing.   Eyes: Negative for visual disturbance.  Respiratory: Negative for shortness of breath.   Cardiovascular: Negative for chest pain and palpitations.  Gastrointestinal: Negative for nausea, vomiting, abdominal pain and diarrhea.  Genitourinary: Positive for dysuria. Negative for difficulty urinating.  Musculoskeletal: Negative for arthralgias and neck pain.  Skin: Negative for color change.  Neurological: Negative for dizziness and weakness.  Psychiatric/Behavioral: Negative for dysphoric mood.      Allergies  Penicillins  Home Medications   Prior to Admission medications   Medication Sig Start Date End Date Taking? Authorizing Provider  cephALEXin (KEFLEX) 500 MG capsule Take 1 capsule (500 mg total) by mouth 4 (four) times daily. 01/09/14   Harle Battiest, NP  clindamycin  (CLEOCIN) 150 MG capsule Take 2 capsules (300 mg total) by mouth 3 (three) times daily. May dispense as  capsules 10/13/13   Jennifer L Piepenbrink, PA-C  ibuprofen (ADVIL,MOTRIN) 800 MG tablet Take 1 tablet (800 mg total) by mouth 3 (three) times daily. 10/13/13   Jennifer L Piepenbrink, PA-C  oxyCODONE-acetaminophen (PERCOCET/ROXICET) 5-325 MG per tablet Take 1 tablet by mouth every 4 (four) hours as needed for severe pain. May take 2 tablets PO q 6 hours for severe pain - Do not take with Tylenol as this tablet already contains tylenol 10/13/13   Jennifer L Piepenbrink, PA-C  phenazopyridine (PYRIDIUM) 200 MG tablet Take 1 tablet (200 mg total) by mouth 3 (three) times daily. 01/09/14   Harle Battiest, NP   BP 136/79 mmHg  Pulse 81  Temp(Src) 98.7 F (37.1 C) (Oral)  Resp 18  Ht  (1.854 m)  Wt 160 lb (72.576 kg)  BMI 21.11 kg/m2  SpO2 100% Physical Exam  Constitutional: He is oriented to person, place, and time. He appears well-developed and well-nourished. No distress.  HENT:  Head: Normocephalic and atraumatic.  Eyes: Conjunctivae and EOM are normal.  Neck: Normal range of motion.  Cardiovascular: Normal rate and regular rhythm.  Exam reveals no gallop and no friction rub.   No murmur heard. Pulmonary/Chest: Effort normal and breath sounds normal. He has no wheezes. He has no rales. He exhibits no tenderness.  Abdominal: Soft. He exhibits no distension. There is no tenderness. There is no rebound.  Musculoskeletal: Normal range of motion.  Neurological: He is alert and oriented to person, place, and time.  Speech is goal-oriented. Moves limbs without ataxia.   Skin: Skin is warm and  dry.  Psychiatric: He has a normal mood and affect. His behavior is normal.  Nursing note and vitals reviewed.   ED Course  Procedures (including critical care time) Labs Review Labs Reviewed  URINALYSIS, ROUTINE W REFLEX MICROSCOPIC - Abnormal; Notable for the following:    Hgb urine  dipstick TRACE (*)    All other components within normal limits  URINE CULTURE  URINE MICROSCOPIC-ADD ON    Imaging Review No results found.   EKG Interpretation None      MDM   Final diagnoses:  Hematuria    9:13 PM Patient's urinalysis shows hematuria without infection. Urine will be sent for culture. Vitals stable and patient afebrile. Patient will speak with case management about PCP follow up.     Emilia BeckKaitlyn Treven Holtman, PA-C 01/17/14 2146  Elwin MochaBlair Walden, MD 01/18/14 Marlyne Beards0002

## 2014-01-19 LAB — URINE CULTURE
Colony Count: NO GROWTH
Culture: NO GROWTH

## 2014-05-05 ENCOUNTER — Emergency Department (HOSPITAL_COMMUNITY)
Admission: EM | Admit: 2014-05-05 | Discharge: 2014-05-05 | Disposition: A | Discharge status: Home / Self Care | Payer: Self-pay | Attending: Emergency Medicine | Admitting: Emergency Medicine

## 2014-05-05 ENCOUNTER — Encounter (HOSPITAL_COMMUNITY): Payer: Self-pay | Admitting: Emergency Medicine

## 2014-05-05 DIAGNOSIS — S3992XA Unspecified injury of lower back, initial encounter: Secondary | ICD-10-CM | POA: Insufficient documentation

## 2014-05-05 DIAGNOSIS — S0083XA Contusion of other part of head, initial encounter: Secondary | ICD-10-CM

## 2014-05-05 DIAGNOSIS — Z791 Long term (current) use of non-steroidal anti-inflammatories (NSAID): Secondary | ICD-10-CM | POA: Insufficient documentation

## 2014-05-05 DIAGNOSIS — Z720 Tobacco use: Secondary | ICD-10-CM | POA: Insufficient documentation

## 2014-05-05 DIAGNOSIS — T148XXA Other injury of unspecified body region, initial encounter: Secondary | ICD-10-CM

## 2014-05-05 DIAGNOSIS — Z79899 Other long term (current) drug therapy: Secondary | ICD-10-CM | POA: Insufficient documentation

## 2014-05-05 DIAGNOSIS — Z792 Long term (current) use of antibiotics: Secondary | ICD-10-CM | POA: Insufficient documentation

## 2014-05-05 DIAGNOSIS — Y9389 Activity, other specified: Secondary | ICD-10-CM | POA: Insufficient documentation

## 2014-05-05 DIAGNOSIS — Y9289 Other specified places as the place of occurrence of the external cause: Secondary | ICD-10-CM | POA: Insufficient documentation

## 2014-05-05 DIAGNOSIS — Y998 Other external cause status: Secondary | ICD-10-CM | POA: Insufficient documentation

## 2014-05-05 DIAGNOSIS — Z880 Allergy status to penicillin: Secondary | ICD-10-CM | POA: Insufficient documentation

## 2014-05-05 DIAGNOSIS — S0081XA Abrasion of other part of head, initial encounter: Secondary | ICD-10-CM | POA: Insufficient documentation

## 2014-05-05 MED ORDER — IBUPROFEN 800 MG PO TABS: 800.0000 mg | ORAL_TABLET | Freq: Three times a day (TID) | ORAL | Status: DC
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
## 2014-05-05 NOTE — Discharge Instructions (Signed)
Assault, General Assault includes any behavior, whether intentional or reckless, which results in bodily injury to another person and/or damage to property. Included in this would be any behavior, intentional or reckless, that by its nature would be understood (interpreted) by a reasonable person as intent to harm another person or to damage his/her property. Threats may be oral or written. They may be communicated through regular mail, computer, fax, or phone. These threats may be direct or implied. FORMS OF ASSAULT INCLUDE:  Physically assaulting a person. This includes physical threats to inflict physical harm as well as:  Slapping.  Hitting.  Poking.  Kicking.  Punching.  Pushing.  Arson.  Sabotage.  Equipment vandalism.  Damaging or destroying property.  Throwing or hitting objects.  Displaying a weapon or an object that appears to be a weapon in a threatening manner.  Carrying a firearm of any kind.  Using a weapon to harm someone.  Using greater physical size/strength to intimidate another.  Making intimidating or threatening gestures.  Bullying.  Hazing.  Intimidating, threatening, hostile, or abusive language directed toward another person.  It communicates the intention to engage in violence against that person. And it leads a reasonable person to expect that violent behavior may occur.  Stalking another person. IF IT HAPPENS AGAIN:  Immediately call for emergency help (911 in U.S[place]  If someone poses clear and immediate danger to you, seek legal authorities to have a protective or restraining order put in place.  Less threatening assaults can at least be reported to authorities. STEPS TO TAKE IF A SEXUAL ASSAULT HAS HAPPENED  Go to an area of safety. This may include a shelter or staying with a friend. Stay away from the area where you have been attacked. A large percentage of sexual assaults are caused by a friend, relative or associate.  If  medications were given by your caregiver, take them as directed for the full length of time prescribed.  Only take over-the-counter or prescription medicines for pain, discomfort, or fever as directed by your caregiver.  If you have come in contact with a sexual disease, find out if you are to be tested again. If your caregiver is concerned about the HIV/AIDS virus, he/she may require you to have continued testing for several months.  For the protection of your privacy, test results can not be given over the phone. Make sure you receive the results of your test. If your test results are not back during your visit, make an appointment with your caregiver to find out the results. Do not assume everything is normal if you have not heard from your caregiver or the medical facility. It is important for you to follow up on all of your test results.  File appropriate papers with authorities. This is important in all assaults, even if it has occurred in a family or by a friend. SEEK MEDICAL CARE IF:  You have new problems because of your injuries.  You have problems that may be because of the medicine you are taking, such as:  Rash.  Itching.  Swelling.  Trouble breathing.  You develop belly (abdominal) pain, feel sick to your stomach (nausea) or are vomiting.  You begin to run a temperature.  You need supportive care or referral to a rape crisis center. These are centers with trained personnel who can help you get through this ordeal. SEEK IMMEDIATE MEDICAL CARE IF:  You are afraid of being threatened, beaten, or abused. In U.S., call 911.  You  receive new injuries related to abuse.  You develop severe pain in any area injured in the assault or have any change in your condition that concerns you.  You faint or lose consciousness.  You develop chest pain or shortness of breath. Document Released: 12/20/2004 Document Revised: 03/14/2011 Document Reviewed: 08/08/2007 [facility] Patient  Information 2015 [place], [place]. This information is not intended to replace advice given to you by your health care provider. Make sure you discuss any questions you have with your health care provider.  Abrasion An abrasion is a cut or scrape of the skin. Abrasions do not extend through all layers of the skin and most heal within 10 days. It is important to care for your abrasion properly to prevent infection. CAUSES  Most abrasions are caused by falling on, or gliding across, the ground or other surface. When your skin rubs on something, the outer and inner layer of skin rubs off, causing an abrasion. DIAGNOSIS  Your caregiver will be able to diagnose an abrasion during a physical exam.  TREATMENT  Your treatment depends on how large and deep the abrasion is. Generally, your abrasion will be cleaned with water and a mild soap to remove any dirt or debris. An antibiotic ointment may be put over the abrasion to prevent an infection. A bandage (dressing) may be wrapped around the abrasion to keep it from getting dirty.  You may need a tetanus shot if:  You cannot remember when you had your last tetanus shot.  You have never had a tetanus shot.  The injury broke your skin. If you get a tetanus shot, your arm may swell, get red, and feel warm to the touch. This is common and not a problem. If you need a tetanus shot and you choose not to have one, there is a rare chance of getting tetanus. Sickness from tetanus can be serious.  HOME CARE INSTRUCTIONS   If a dressing was applied, change it at least once a day or as directed by your caregiver. If the bandage sticks, soak it off with warm water.   Wash the area with water and a mild soap to remove all the ointment 2 times a day. Rinse off the soap and pat the area dry with a clean towel.   Reapply any ointment as directed by your caregiver. This will help prevent infection and keep the bandage from sticking. Use gauze over the wound and under  the dressing to help keep the bandage from sticking.   Change your dressing right away if it becomes wet or dirty.   Only take over-the-counter or prescription medicines for pain, discomfort, or fever as directed by your caregiver.   Follow up with your caregiver within 24-48 hours for a wound check, or as directed. If you were not given a wound-check appointment, look closely at your abrasion for redness, swelling, or pus. These are signs of infection. SEEK IMMEDIATE MEDICAL CARE IF:   You have increasing pain in the wound.   You have redness, swelling, or tenderness around the wound.   You have pus coming from the wound.   You have a fever or persistent symptoms for more than 2-3 days.  You have a fever and your symptoms suddenly get worse.  You have a bad smell coming from the wound or dressing.  MAKE SURE YOU:   Understand these instructions.  Will watch your condition.  Will get help right away if you are not doing well or get worse. Document  Released: 09/29/2004 Document Revised: 12/07/2011 Document Reviewed: 11/23/2010 [facility] Patient Information 2015 [place], [place]. This information is not intended to replace advice given to you by your health care provider. Make sure you discuss any questions you have with your health care provider. Contusion A contusion is a deep bruise. Contusions are the result of an injury that caused bleeding under the skin. The contusion may turn blue, purple, or yellow. Minor injuries will give you a painless contusion, but more severe contusions may stay painful and swollen for a few weeks.  CAUSES  A contusion is usually caused by a blow, trauma, or direct force to an area of the body. SYMPTOMS   Swelling and redness of the injured area.  Bruising of the injured area.  Tenderness and soreness of the injured area.  Pain. DIAGNOSIS  The diagnosis can be made by taking a history and physical exam. An X-ray, CT scan, or MRI may be  needed to determine if there were any associated injuries, such as fractures. TREATMENT  Specific treatment will depend on what area of the body was injured. In general, the best treatment for a contusion is resting, icing, elevating, and applying cold compresses to the injured area. Over-the-counter medicines may also be recommended for pain control. Ask your caregiver what the best treatment is for your contusion. HOME CARE INSTRUCTIONS   Put ice on the injured area.  Put ice in a plastic bag.  Place a towel between your skin and the bag.  Leave the ice on for 15-20 minutes, 3-4 times a day, or as directed by your health care provider.  Only take over-the-counter or prescription medicines for pain, discomfort, or fever as directed by your caregiver. Your caregiver may recommend avoiding anti-inflammatory medicines (aspirin, ibuprofen, and naproxen) for 48 hours because these medicines may increase bruising.  Rest the injured area.  If possible, elevate the injured area to reduce swelling. SEEK IMMEDIATE MEDICAL CARE IF:   You have increased bruising or swelling.  You have pain that is getting worse.  Your swelling or pain is not relieved with medicines. MAKE SURE YOU:   Understand these instructions.  Will watch your condition.  Will get help right away if you are not doing well or get worse. Document Released: 09/29/2004 Document Revised: 12/25/2012 Document Reviewed: 10/25/2010 [facility] Patient Information 2015 [place], [place]. This information is not intended to replace advice given to you by your health care provider. Make sure you discuss any questions you have with your health care provider.  Emergency Department Resource Guide 1) Find a Doctor and Pay Out of Pocket Although you won't have to find out who is covered by your insurance plan, it is a good idea to ask around and get recommendations. You will then need to call the office and see if the doctor you have chosen  will accept you as a new patient and what types of options they offer for patients who are self-pay. Some doctors offer discounts or will set up payment plans for their patients who do not have insurance, but you will need to ask so you aren't surprised when you get to your appointment.  2) Contact Your Local Health Department Not all health departments have doctors that can see patients for sick visits, but many do, so it is worth a call to see if yours does. If you don't know where your local health department is, you can check in your phone book. The CDC also has a tool to help you  locate your state's health department, and many state websites also have listings of all of their local health departments.  3) Find a Walk-in Clinic If your illness is not likely to be very severe or complicated, you may want to try a walk in clinic. These are popping up all over the country in pharmacies, drugstores, and shopping centers. They're usually staffed by nurse practitioners or physician assistants that have been trained to treat common illnesses and complaints. They're usually fairly quick and inexpensive. However, if you have serious medical issues or chronic medical problems, these are probably not your best option.  No Primary Care Doctor: - Call Health Connect at  [phone] - they can help you locate a primary care doctor that  accepts your insurance, provides certain services, etc. - Physician Referral Service- [phone]  Chronic Pain Problems: Organization         Address  Phone   Notes  [facility]  [phone] Patients need to be referred by their primary care doctor.   Medication Assistance: Organization         Address  Phone   Notes  [facility] Medication [facility] [address] [place] [place], [place] [address] [phone] --Must be a resident of [facility] -- Must have NO insurance coverage whatsoever (no Medicaid/ Medicare, etc.) --  The pt. MUST have a primary care doctor that directs their care regularly and follows them in the community   [facility]ist  [phone]   [employer]  [phone]    Agencies that provide inexpensive medical care: Organization         Address  Phone   Notes  [facility]  [phone]   [facility]    [phone]   [facility] [address] [place], [place] [address] [phone]   [facility] [address] [place], [place] [phone]   Planned Parenthood    [phone]   [facility]    [phone]   Community Health and [facility]  [address], [place] Phone:  [phone], Fax:  [phone] Hours of Operation:  9 am - 6 pm, M-F.  Also accepts Medicaid/Medicare and self-pay.  [facility]  [address], [place] Phone: [phone], Fax: [phone]. Hours of Operation:  8:30 am - 5:30 pm, M-F.  Also accepts Medicaid and self-pay.  [facility] [place] [address] [place] Phone: [phone]   [facility] [address] Natasha Bence [place], [place] [phone], Ext. [phone] Mondays & Thursdays: 7-9 AM.  First 15 patients are seen on a first come, first serve basis.    Medicaid-accepting [facility] Providers:  Organization         Address  Phone   Notes  [facility] [address], [place] [phone] Also accepts self-pay patients.  [facility] [address] Laurell Josephs [place], [place]  [phone]   [facility] [address], [place] [phone]   [facility] [address] [place] [phone]   Renaye Rakers [address], [place]   [phone] Only accepts [place] patients after they have their name applied to their card.   Self-Pay (no insurance)  in [facility]:  Organization  Address  Phone   Notes  Sickle Cell Patients, [facility] [address] [place], [place] [phone]   [facility] [address] [place], [place] [phone]   [facility]  [address], [place] [phone]   [facility]/Dr. Osei-Bonsu  [address] [place] or [address], [place] [phone] Phone number for both [place] and [place] locations is the same.  [facility]gent Medical and [facility] [address], [place] [phone]   [facility] [address], [place] or [address] [phone]   [facility] [address], [place] [phone], phone; [phone], fax Sees patients 1st and 3rd Saturday of every month.  Must not qualify for public or private insurance (i.e. Medicaid, Medicare, [place] Health Choice, Veterans' Benefits)  Household income should be no more than 200% of the poverty level The clinic cannot treat you if you are pregnant or think you are pregnant  Sexually transmitted diseases are not treated at the clinic.    Dental Care: Organization         Address  Phone  Notes  [facility] Department of [facility] [address] [place], [place] [phone] Accepts children up to age 57 who are enrolled in [place] or [place] Health Choice; pregnant women with a Medicaid card; and children who have applied for Medicaid or [place] Health Choice, but were declined, whose parents can pay a reduced fee at time of service.  [facility] of [facility]  [address], [place] [phone] Accepts children up to age 21 who are enrolled in [place] or [place] Health Choice; pregnant women with a Medicaid card; and children who have applied for Medicaid or [place] Health Choice, but were declined, whose  parents can pay a reduced fee at time of service.  [facility]  [address] [place], [place] [phone] Patients are seen by appointment only. Walk-ins are not accepted. [facility] will see patients 64 years of age and older. Monday - Tuesday (8am-5pm) Most Wednesdays (8:30-5pm) $30 per visit, cash only  [facility] Adult Dental Access PRO[facility]  [address], [facility] [phone] Patients are seen by appointment only. Walk-ins are not accepted. [facility] will see patients 14 years of age and older. One Wednesday Evening (Monthly: Volunteer Based).  $30 per visit, cash only  [employer]  [phone] for adults; Children under age 50, call Graduate Pediatric Dentistry at [phone]. Children aged 44-14, please call [phone] to request a pediatric application.  Dental services are provided in all areas of dental care including fillings, crowns and bridges, complete and partial dentures, implants, gum treatment, root canals, and extractions. Preventive care is also provided. Treatment is provided to both adults and children. Patients are selected via a lottery and there is often a waiting list.   [facility] [address] [place]  [phone] [email or web address]   [facility] [address] [place], [place] [phone], Ext. [phone] Second and Fourth Thursday of each month, opens at 6:30 AM; Clinic ends at 9 AM.  Patients are seen on a first-come first-served basis, and a limited number are seen during each clinic.   [facility]  [address] [place],  [place] [phone]   Eligibility Requirements You must have lived in [place], [place], or [place] counties for at least the last three months.   You cannot be eligible for state or federal sponsored [place] Cityhealthcare insurance, including CIG[employer]eterans Administration, [place]icaid, or [employer]icare.   You generally cannot be eligible for  healthcare insurance through your employer.    How to apply: Eligibility screenings are held every Tuesday and Wednesday afternoon from 1:00 pm until 4:00 pm. You do not need an appointment for the interview!  [facility] [address], [place], [place] [phone]   [facility]  [phone]   [facility]  [phone]   [facility]  [phone]    Behavioral Health Resources in the Community: Intensive Outpatient Programs Organization         Address  Phone  Notes  [facility] [address], [place], [place] [phone]   [facility] [address], [place], [place] [phone]   [facility] [address], [place], [place]  [phone]   [facility] [address],  [place], [place] [phone] or [phone]   Substance Abuse Resources Organization         Address  Phone  Notes  [facility]  [phone]   [facility]  [phone]   [facility]  [phone]   [facility]  [phone]   Residential & Outpatient Substance Abuse Program  [phone]   Psychological Services Organization         Address  Phone  Notes  [facility]  [phone]- [phone]   [facility]  [phone]- [phone]   [facility] [address], [place] [phone] or [phone]    Mobile Crisis Teams Organization         Address  Phone  Notes  [facility], Mobile Crisis Care Unit  [phone]   [facility]  [address] [place], [place] [phone]   Doristine LocksSharon DeEsch [address] [place] [phone]    Self-Help/Support Groups Organization         Address  Phone             Notes  [facility] [place] - variety of support groups  [phone] Call for more information  Narcotics  Anonymous (NA), [facility] [address], [place]  2 meetings at this location   Statisticianesidential Treatment Programs Organization         Address  Phone  Notes  [facility] [address],    [place]  [phone]   [facility]  [address], [place] [phone], [place], [place] [phone]   [facility] [address], [place] [phone] Admissions: 8am-3pm M-F  [facility] [address]    [place], [place] [phone]   [facility] [address], [place], [place] [phone]   [facility] [address]  [place], [place] [phone]   [facility] - Intensive Outpatient [address], [place], [place] [phone]   [facility] ([facility]iction [facility] [address]  [place], [place] [phone] or [phone]   [facility] ([facility]) [address] [place], [place] [phone] Accepts Medicaid  [facility] [address]  [place] [phone] Substance Abuse/Addiction Treatment   [facility] Resources Organization  Address  Phone  Notes  [facility]  [phone]   Angie Fava, PhD [address] Ervin Knack [place], [place]   [phone] or [phone]   [facility]   [address] [place], [place] [phone]   [facility] [address] [place], [place] [phone] Insurance/Medicaid/sponsorship through [facility] and Families [address]                                    [place], [place] [phone] Therapy/tele-psych/case  [facility] [address] [place], [place] [phone]    Dr. Lolly Mustache  [phone]   [facility]  [employer] [facility] Dept. 1) [address], [place] 2) [address] [place] 3)  [address], [place] [phone]  [phone]   [facility] Child Abuse Hotline [phone] or [phone] (After Hours)
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
## 2018-05-30 ENCOUNTER — Encounter (HOSPITAL_COMMUNITY): Payer: Self-pay | Admitting: *Deleted

## 2018-05-30 ENCOUNTER — Emergency Department (HOSPITAL_COMMUNITY): Payer: 59

## 2018-05-30 ENCOUNTER — Other Ambulatory Visit: Payer: Self-pay

## 2018-05-30 ENCOUNTER — Emergency Department (HOSPITAL_COMMUNITY)
Admission: EM | Admit: 2018-05-30 | Discharge: 2018-05-30 | Disposition: A | Discharge status: Home / Self Care | Payer: 59 | Attending: Emergency Medicine | Admitting: Emergency Medicine

## 2018-05-30 DIAGNOSIS — S2231XA Fracture of one rib, right side, initial encounter for closed fracture: Secondary | ICD-10-CM | POA: Diagnosis not present

## 2018-05-30 DIAGNOSIS — W01198A Fall on same level from slipping, tripping and stumbling with subsequent striking against other object, initial encounter: Secondary | ICD-10-CM | POA: Diagnosis not present

## 2018-05-30 DIAGNOSIS — M545 Low back pain: Secondary | ICD-10-CM | POA: Insufficient documentation

## 2018-05-30 DIAGNOSIS — Y92017 Garden or yard in single-family (private) house as the place of occurrence of the external cause: Secondary | ICD-10-CM | POA: Insufficient documentation

## 2018-05-30 DIAGNOSIS — F17210 Nicotine dependence, cigarettes, uncomplicated: Secondary | ICD-10-CM | POA: Insufficient documentation

## 2018-05-30 DIAGNOSIS — Y999 Unspecified external cause status: Secondary | ICD-10-CM | POA: Diagnosis not present

## 2018-05-30 DIAGNOSIS — S30810A Abrasion of lower back and pelvis, initial encounter: Secondary | ICD-10-CM | POA: Insufficient documentation

## 2018-05-30 DIAGNOSIS — Y939 Activity, unspecified: Secondary | ICD-10-CM | POA: Diagnosis not present

## 2018-05-30 DIAGNOSIS — R0789 Other chest pain: Secondary | ICD-10-CM | POA: Diagnosis present

## 2018-05-30 MED ORDER — KETOROLAC TROMETHAMINE 30 MG/ML IJ SOLN
30.0000 mg | Freq: Once | INTRAMUSCULAR | Status: AC
  Administered 2018-05-30: 30 mg via INTRAMUSCULAR
  Filled 2018-05-30: qty 1

## 2018-05-30 MED ORDER — OXYCODONE-ACETAMINOPHEN 5-325 MG PO TABS: 1.0000 | ORAL_TABLET | Freq: Four times a day (QID) | ORAL | 0 refills | Status: AC | PRN

## 2018-05-30 NOTE — Discharge Instructions (Addendum)
Please read attached information. If you experience any new or worsening signs or symptoms please return to the emergency room for evaluation. Please follow-up with your primary care provider or specialist as discussed. Please use medication prescribed only as directed and discontinue taking if you have any concerning signs or symptoms.   °
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
## 2018-05-30 NOTE — ED Notes (Signed)
TO XRAY

## 2018-05-30 NOTE — ED Notes (Signed)
Patient verbalizes understanding of discharge instructions . Opportunity for questions and answers were provided . Armband removed by staff ,Pt discharged from ED. W/C  offered at D/C  and Declined W/C at D/C and was escorted to lobby by RN.  

## 2020-02-07 ENCOUNTER — Encounter (HOSPITAL_COMMUNITY): Payer: Self-pay

## 2020-02-07 ENCOUNTER — Other Ambulatory Visit: Payer: Self-pay

## 2020-02-07 ENCOUNTER — Emergency Department (HOSPITAL_COMMUNITY)
Admission: EM | Admit: 2020-02-07 | Discharge: 2020-02-07 | Disposition: A | Discharge status: Home / Self Care | Payer: 59 | Attending: Emergency Medicine | Admitting: Emergency Medicine

## 2020-02-07 DIAGNOSIS — Z5321 Procedure and treatment not carried out due to patient leaving prior to being seen by health care provider: Secondary | ICD-10-CM | POA: Insufficient documentation

## 2020-02-07 DIAGNOSIS — M25571 Pain in right ankle and joints of right foot: Secondary | ICD-10-CM | POA: Insufficient documentation
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
## 2020-02-07 NOTE — ED Notes (Signed)
Pt did not answer to have vitals reassessed. Pt called X 4 and now will be moved OTF.

## 2020-02-26 IMAGING — CR RIGHT RIBS AND CHEST - 3+ VIEW
5 series · 5 of 5 positions shown · non-contrast
Comparison: Chest x-ray dated 01/31/2004 could not be retrieved for
comparison.
COMPARISON: Chest x-ray dated 01/31/2004 could not be retrieved for
comparison.

Addendum:
CLINICAL DATA: Right posterior rib pain.

EXAM:
RIGHT RIBS AND CHEST - 3+ VIEW

[chest ap]
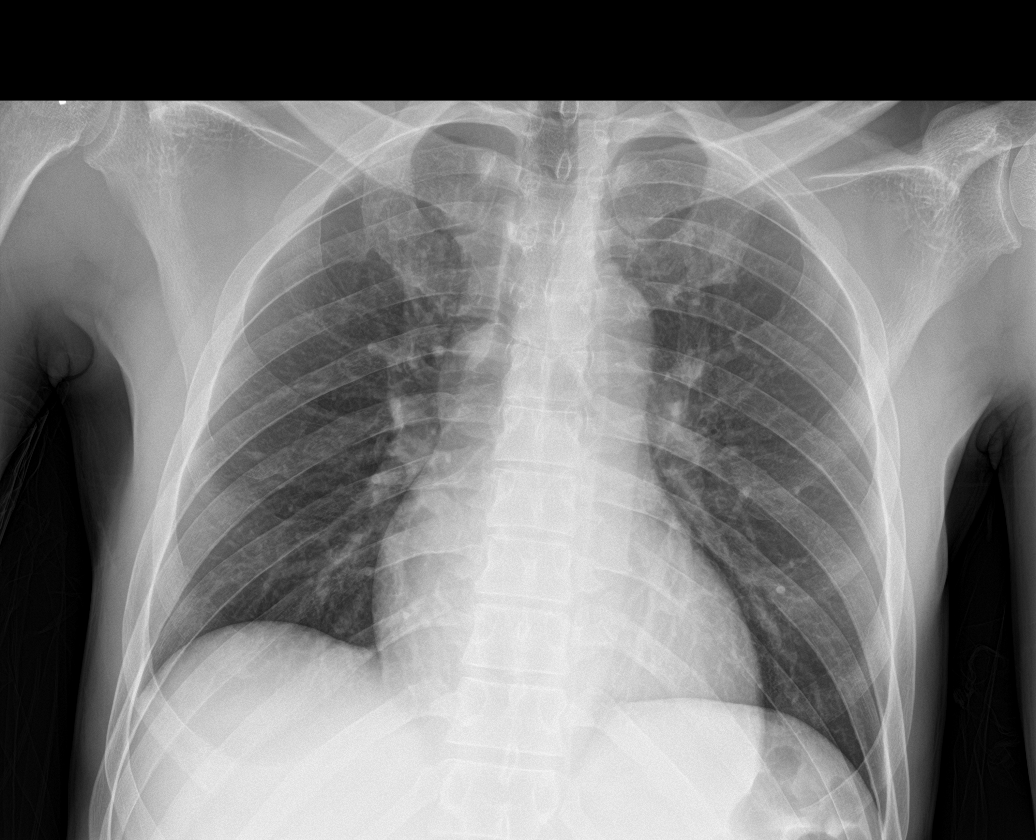

[rib ap (1 of 2)]
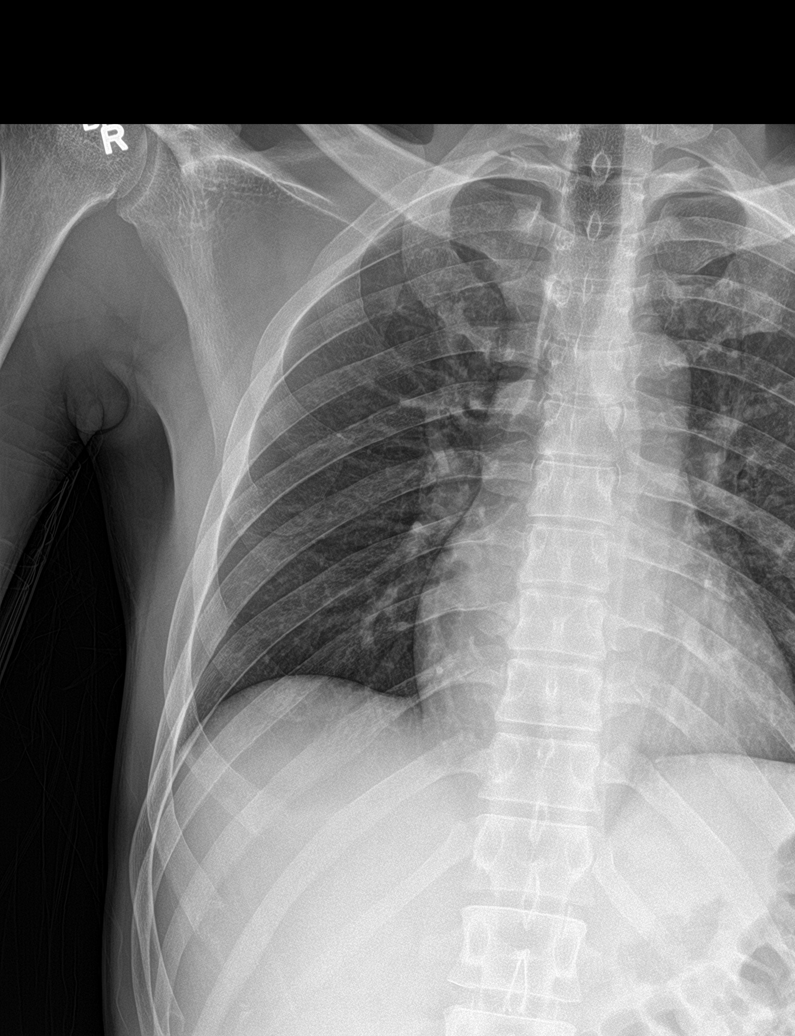

[rib ap obl (1 of 2)]
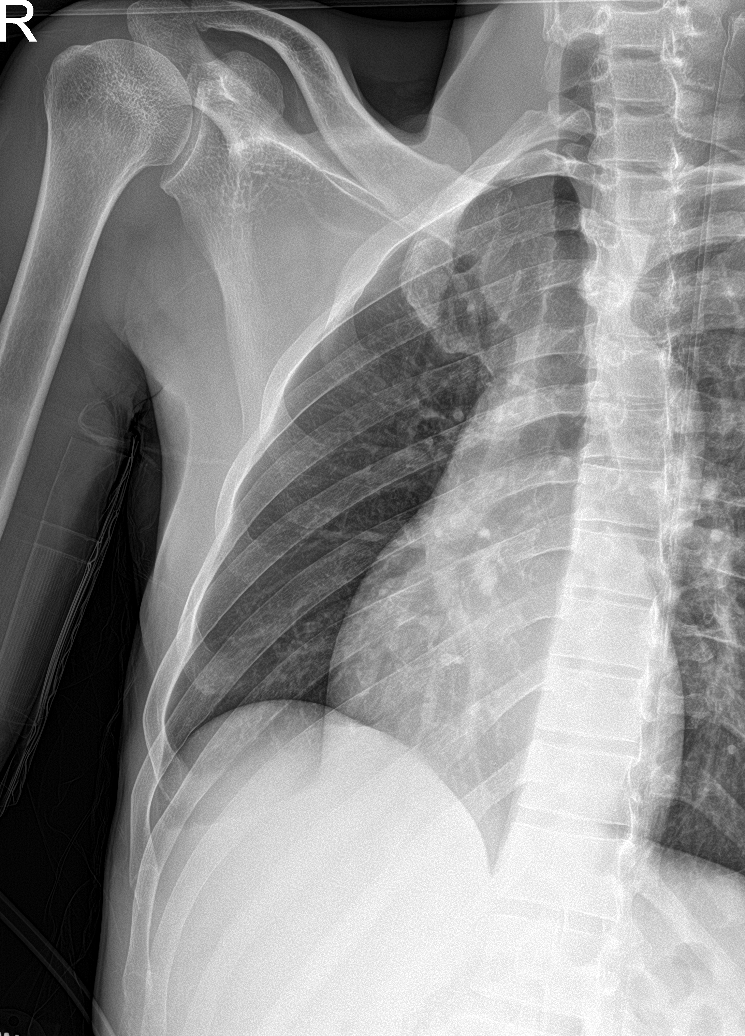

[rib ap (2 of 2)]
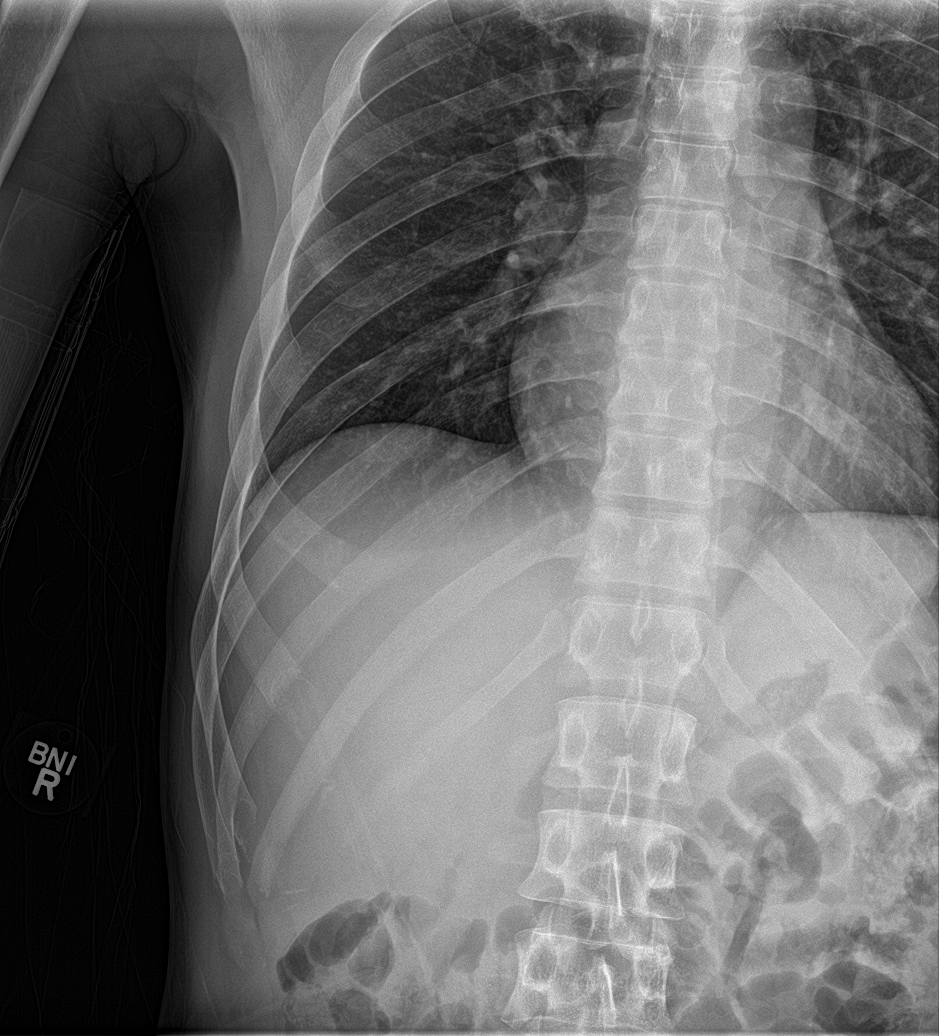

[rib ap obl (2 of 2)]
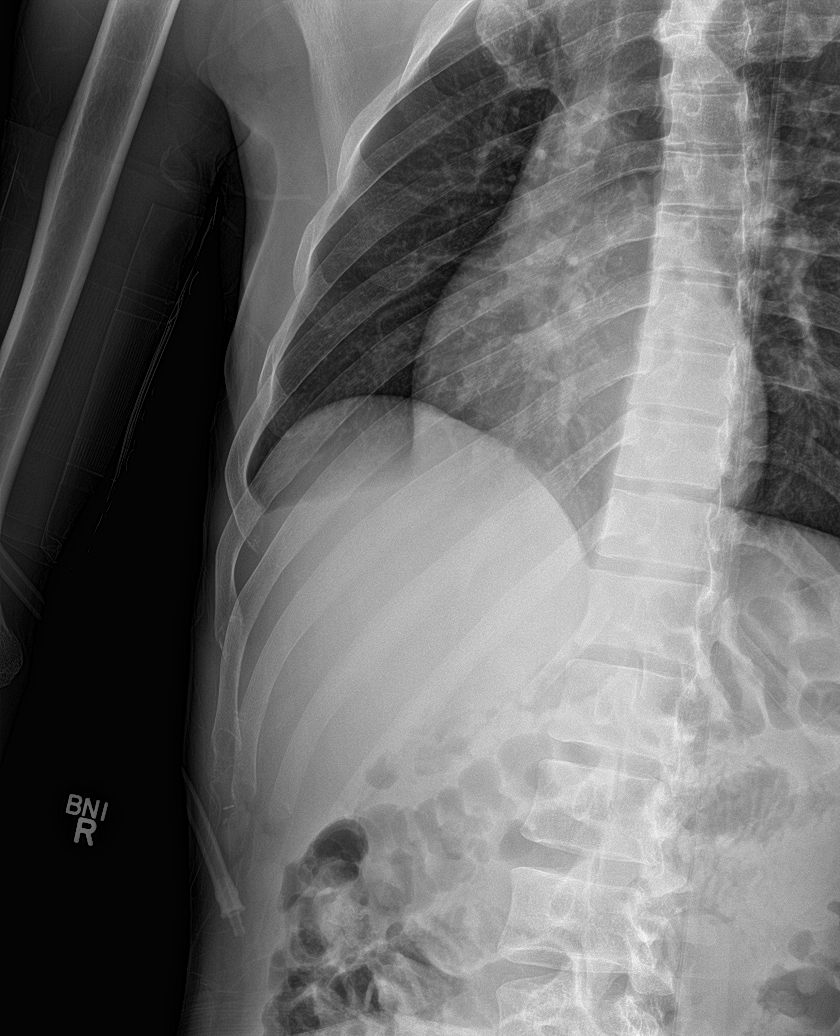

[5 of 5 positions shown; findings below may reference images not displayed]

FINDINGS: No fracture or other bone lesions are seen involving the ribs. There
is no evidence of pneumothorax or pleural effusion. Both lungs are
clear. Heart size and mediastinal contours are within normal limits.
IMPRESSION: Negative.

ADDENDUM:
There is a nondisplaced fracture involving the posterior tenth rib
on the right. This is better visualized on the lumbar spine
radiographs.

*** End of Addendum ***
FINDINGS: No fracture or other bone lesions are seen involving the ribs. There
is no evidence of pneumothorax or pleural effusion. Both lungs are
clear. Heart size and mediastinal contours are within normal limits.
IMPRESSION: Negative.

## 2020-11-01 ENCOUNTER — Other Ambulatory Visit: Payer: Self-pay

## 2020-11-01 ENCOUNTER — Encounter (HOSPITAL_BASED_OUTPATIENT_CLINIC_OR_DEPARTMENT_OTHER): Payer: Self-pay | Admitting: Obstetrics and Gynecology

## 2020-11-01 ENCOUNTER — Emergency Department (HOSPITAL_BASED_OUTPATIENT_CLINIC_OR_DEPARTMENT_OTHER)
Admission: EM | Admit: 2020-11-01 | Discharge: 2020-11-01 | Disposition: A | Discharge status: Home / Self Care | Payer: 59 | Attending: Emergency Medicine | Admitting: Emergency Medicine

## 2020-11-01 ENCOUNTER — Emergency Department (HOSPITAL_BASED_OUTPATIENT_CLINIC_OR_DEPARTMENT_OTHER): Payer: 59 | Admitting: Radiology

## 2020-11-01 DIAGNOSIS — S61219A Laceration without foreign body of unspecified finger without damage to nail, initial encounter: Secondary | ICD-10-CM

## 2020-11-01 DIAGNOSIS — F17200 Nicotine dependence, unspecified, uncomplicated: Secondary | ICD-10-CM | POA: Insufficient documentation

## 2020-11-01 DIAGNOSIS — S61411A Laceration without foreign body of right hand, initial encounter: Secondary | ICD-10-CM | POA: Insufficient documentation

## 2020-11-01 MED ORDER — CEPHALEXIN 500 MG PO CAPS: 500.0000 mg | ORAL_CAPSULE | Freq: Four times a day (QID) | ORAL | 0 refills | Status: AC

## 2020-11-01 MED ORDER — LIDOCAINE HCL (PF) 1 % IJ SOLN
INTRAMUSCULAR | Status: AC
  Administered 2020-11-01: 5 mL
  Filled 2020-11-01: qty 5

## 2020-11-01 MED ORDER — HYDROCODONE-ACETAMINOPHEN 5-325 MG PO TABS
1.0000 | ORAL_TABLET | Freq: Once | ORAL | Status: AC
  Administered 2020-11-01: 1 via ORAL
  Filled 2020-11-01: qty 1

## 2020-11-01 MED ORDER — LIDOCAINE HCL (PF) 1 % IJ SOLN
INTRAMUSCULAR | Status: AC
  Filled 2020-11-01: qty 5

## 2020-11-01 MED ORDER — LIDOCAINE HCL (PF) 1 % IJ SOLN
5.0000 mL | Freq: Once | INTRAMUSCULAR | Status: AC
  Administered 2020-11-01: 5 mL
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
## 2020-11-01 NOTE — ED Notes (Signed)
Pt lacerations cleaned, covered with Bacitracin, non stick bandage and kerlix.

## 2020-11-01 NOTE — ED Notes (Signed)
Lac tray and lido at bedside for repair
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
## 2020-11-01 NOTE — ED Notes (Signed)
Pt NAD, a/ox4. Pt verbalizes understanding of all DC and f/u instructions. All questions answered. Pt walks with steady gait to lobby at DC.  ? ?
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
## 2020-11-01 NOTE — ED Notes (Signed)
Provider at bedside to finish lac repair

## 2020-11-01 NOTE — Discharge Instructions (Addendum)
He has stitches placed to the left hand.  The stitches will need to be removed between 5 to 10 days from now.  You can have these removed by your primary care provider or you can return to the emergency room.  If you develop drainage from any of the wounds, worsening redness, if you develop a fever please return to the emergency room.

## 2020-11-01 NOTE — ED Notes (Signed)
Pt given water per request

## 2020-11-02 ENCOUNTER — Telehealth (HOSPITAL_COMMUNITY): Payer: Self-pay | Admitting: Emergency Medicine

## 2020-11-02 NOTE — Telephone Encounter (Signed)
Referral for hand/ortho f/u

## 2020-11-09 ENCOUNTER — Ambulatory Visit: Payer: 59 | Admitting: Orthopedic Surgery

## 2020-11-19 ENCOUNTER — Emergency Department (HOSPITAL_COMMUNITY)
Admission: EM | Admit: 2020-11-19 | Discharge: 2020-11-19 | Disposition: A | Discharge status: Home / Self Care | Payer: Self-pay | Attending: Student | Admitting: Student

## 2020-11-19 ENCOUNTER — Encounter (HOSPITAL_COMMUNITY): Payer: Self-pay | Admitting: Emergency Medicine

## 2020-11-19 DIAGNOSIS — Z4802 Encounter for removal of sutures: Secondary | ICD-10-CM | POA: Insufficient documentation

## 2020-11-19 DIAGNOSIS — W260XXD Contact with knife, subsequent encounter: Secondary | ICD-10-CM | POA: Insufficient documentation

## 2020-11-19 DIAGNOSIS — F17200 Nicotine dependence, unspecified, uncomplicated: Secondary | ICD-10-CM | POA: Insufficient documentation

## 2020-11-19 DIAGNOSIS — S61412D Laceration without foreign body of left hand, subsequent encounter: Secondary | ICD-10-CM | POA: Insufficient documentation

## 2020-11-19 MED ORDER — IBUPROFEN 200 MG PO TABS
600.0000 mg | ORAL_TABLET | Freq: Once | ORAL | Status: AC
  Administered 2020-11-19: 15:00:00 600 mg via ORAL
  Filled 2020-11-19: qty 1
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
## 2020-11-19 NOTE — ED Triage Notes (Signed)
Patient here requesting removal of sutures that he received from urgent care two weeks ago. Patient has no complains and is only requesting suture removal. Patient is alert, oriented, and in no apparent distress at this time.

## 2020-11-19 NOTE — Discharge Instructions (Addendum)
Please read and follow all provided instructions.  Your diagnoses today include:  1. Visit for suture removal    Tests performed today include: Vital signs. See below for your results today.   Medications prescribed:  None  Take any prescribed medications only as directed.  Home care instructions:  Follow any educational materials contained in this packet Follow R.I.C.E. Protocol: R - rest your injury  I  - use ice on injury without applying directly to skin C - compress injury with bandage or splint E - elevate the injury as much as possible  Follow-up instructions: Given your continued difficulty with extending her fingers and numbness, it would be helpful for you to follow-up with an orthopedic hand doctor.  Please follow-up with referral given.  Return instructions:  Please return if your fingers are numb or tingling, appear gray or blue, or you have severe pain (also elevate the arm and loosen splint or wrap if you were given one) Please return to the Emergency Department if you experience worsening symptoms.  Please return if you have any other emergent concerns.  Additional Information:  Your vital signs today were: BP 118/90 (BP Location: Right Arm)   Pulse 88   Temp 98.4 F (36.9 C) (Oral)   Resp 16   SpO2 99%  If your blood pressure (BP) was elevated above 135/85 this visit, please have this repeated by your doctor within one month. --------------

## 2021-01-01 ENCOUNTER — Encounter (HOSPITAL_COMMUNITY): Payer: Self-pay

## 2021-01-01 ENCOUNTER — Emergency Department (HOSPITAL_COMMUNITY)
Admission: EM | Admit: 2021-01-01 | Discharge: 2021-01-01 | Disposition: A | Discharge status: Home / Self Care | Payer: Self-pay | Attending: Emergency Medicine | Admitting: Emergency Medicine

## 2021-01-01 DIAGNOSIS — K0889 Other specified disorders of teeth and supporting structures: Secondary | ICD-10-CM

## 2021-01-01 DIAGNOSIS — S0181XA Laceration without foreign body of other part of head, initial encounter: Secondary | ICD-10-CM

## 2021-01-01 DIAGNOSIS — F17200 Nicotine dependence, unspecified, uncomplicated: Secondary | ICD-10-CM | POA: Insufficient documentation

## 2021-01-01 DIAGNOSIS — W1809XA Striking against other object with subsequent fall, initial encounter: Secondary | ICD-10-CM | POA: Insufficient documentation

## 2021-01-01 DIAGNOSIS — Y9289 Other specified places as the place of occurrence of the external cause: Secondary | ICD-10-CM | POA: Insufficient documentation

## 2021-01-01 DIAGNOSIS — K029 Dental caries, unspecified: Secondary | ICD-10-CM | POA: Insufficient documentation

## 2021-01-01 DIAGNOSIS — S01112A Laceration without foreign body of left eyelid and periocular area, initial encounter: Secondary | ICD-10-CM | POA: Insufficient documentation

## 2021-01-01 MED ORDER — CLINDAMYCIN HCL 150 MG PO CAPS: 150.0000 mg | ORAL_CAPSULE | Freq: Four times a day (QID) | ORAL | 0 refills | Status: DC
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
## 2021-01-01 NOTE — Discharge Instructions (Addendum)
Return for any problem.  Your laceration has been closed with skin adhesive.  This will flake off on its own over the next 1-2 weeks.  Your dental pain requires close follow-up with dentistry.  You are provided with a prescription of antibiotics to help reduce pain and swelling in the affected tooth.  Please take all of prescribed antibiotic.

## 2021-01-06 ENCOUNTER — Encounter (HOSPITAL_COMMUNITY): Payer: Self-pay | Admitting: Emergency Medicine

## 2021-01-06 ENCOUNTER — Emergency Department (HOSPITAL_COMMUNITY)
Admission: EM | Admit: 2021-01-06 | Discharge: 2021-01-06 | Disposition: A | Discharge status: Home / Self Care | Payer: Self-pay | Attending: Emergency Medicine | Admitting: Emergency Medicine

## 2021-01-06 DIAGNOSIS — K0889 Other specified disorders of teeth and supporting structures: Secondary | ICD-10-CM | POA: Insufficient documentation

## 2021-01-06 MED ORDER — OXYCODONE-ACETAMINOPHEN 5-325 MG PO TABS
1.0000 | ORAL_TABLET | Freq: Once | ORAL | Status: AC
  Administered 2021-01-06: 1 via ORAL
  Filled 2021-01-06: qty 1

## 2021-01-06 MED ORDER — IBUPROFEN 800 MG PO TABS: 800.0000 mg | ORAL_TABLET | Freq: Three times a day (TID) | ORAL | 0 refills | Status: DC | PRN

## 2021-01-06 MED ORDER — CLINDAMYCIN HCL 150 MG PO CAPS: 450.0000 mg | ORAL_CAPSULE | Freq: Three times a day (TID) | ORAL | 0 refills | Status: AC

## 2021-01-06 MED ORDER — IBUPROFEN 800 MG PO TABS
800.0000 mg | ORAL_TABLET | Freq: Once | ORAL | Status: AC
  Administered 2021-01-06: 800 mg via ORAL
  Filled 2021-01-06: qty 1

## 2021-01-06 MED ORDER — CLINDAMYCIN HCL 150 MG PO CAPS
450.0000 mg | ORAL_CAPSULE | Freq: Three times a day (TID) | ORAL | Status: DC
  Administered 2021-01-06: 450 mg via ORAL
  Filled 2021-01-06: qty 3

## 2021-01-06 NOTE — ED Triage Notes (Signed)
Patient here with complaint of left lower dental pain that started a few days ago. States he was prescribed antibiotics a few days ago but states pain has only gotten worse. Patient afebrile in triage and is alert, oriented, ambulatory, and in no apaprent distress at this time.

## 2021-01-06 NOTE — ED Provider Notes (Signed)
Emergency Department Provider Note   I have reviewed the triage vital signs and the nursing notes.   HISTORY  Chief Complaint Dental Pain   HPI Richard Tate is a 36 y.o. male returns to the ED with left dental pain and jaw swelling. No fever. He was seen in the ED two days prior with pain and started on Clindamycin 150 mg tabs. No fever. He notes pain but no SOB or difficulty swallowing.  No radiation of symptoms.     History reviewed. No pertinent past medical history.  Review of Systems  Constitutional: No fever/chills Eyes: No visual changes. ENT: No sore throat. Positive dental and jaw pain (left).  Cardiovascular: Denies chest pain. Respiratory: Denies shortness of breath. Gastrointestinal: No abdominal pain.   Neurological: Negative for headaches.   ____________________________________________   PHYSICAL EXAM:  VITAL SIGNS: ED Triage Vitals  Enc Vitals Group     BP 01/06/21 1228 (!) 138/95     Pulse Rate 01/06/21 1228 76     Resp 01/06/21 1228 16     Temp 01/06/21 1228 98 F (36.7 C)     Temp src --      SpO2 01/06/21 1228 98 %   Constitutional: Alert and oriented. Well appearing and in no acute distress. Eyes: Conjunctivae are normal.  Head: Atraumatic. Nose: No congestion/rhinnorhea. Mouth/Throat: Mucous membranes are moist.  Oropharynx non-erythematous. Diffusely poor dentition. Swelling to the left mandible but no trismus. Soft submandibular compartment. Clear voice.  Neck: No stridor.  Cardiovascular: Normal rate, regular rhythm. Good peripheral circulation. Grossly normal heart sounds.   Respiratory: Normal respiratory effort.  No retractions. Lungs CTAB. Gastrointestinal: No distention.  Musculoskeletal: No gross deformities of extremities. Neurologic:  Normal speech and language.  Skin:  Skin is warm, dry and intact. No rash noted.   ____________________________________________   PROCEDURES  Procedure(s) performed:    Procedures  None ____________________________________________   INITIAL IMPRESSION / ASSESSMENT AND PLAN / ED COURSE  Pertinent labs & imaging results that were available during my care of the patient were reviewed by me and considered in my medical decision making (see chart for details).   This patient is Presenting for Evaluation of jaw pain, which does require a range of treatment options, and is a complaint that involves a high risk of morbidity and mortality.  The Differential Diagnoses include dental abscess, ludwig's angina, retropharyngeal abscess, PTA.  Medical Decision Making: Summary:  Patient is overall well appearing here with dental pain. Was prescribed Clindamycin but dose was low for abscess. No hard signs on exam to prompt CT imaging. Posterior oropharynx not consistent with PTA. Pain medication given in the ED and he is tolerating his first dose of abx here.     Reevaluation with update and discussion with patient. Provided contact information for dentist on call.    I decided to review pertinent External Data, and in summary patient evaluated in the ED 4 days prior and discharged home on low dose Clindamycin.    Clinical Laboratory Tests: Considered lab testing but patient is overall well appearing. No SIRS vitals. No indication for emergent lab testing for now.   Radiologic Tests Considered: Considered CT max face but ultimately swelling appears lateral with no abscess to drain. No trismus. No hard signs of deeper space infection requiring emergent imaging.    Social Determinants of Health Risk patient with poor PCP follow up and no dentist.    ____________________________________________  FINAL CLINICAL IMPRESSION(S) / ED DIAGNOSES  Final diagnoses:  Pain, dental     MEDICATIONS GIVEN DURING THIS VISIT:  Medications  ibuprofen (ADVIL) tablet 800 mg (800 mg Oral Given 01/06/21 1708)  oxyCODONE-acetaminophen (PERCOCET/ROXICET) 5-325 MG per tablet 1  tablet (1 tablet Oral Given 01/06/21 1708)     Note:  This document was prepared using Dragon voice recognition software and may include unintentional dictation errors.  Alona Bene, MD, [facility]    Richard Tate, Arlyss Repress, MD 01/10/21 [phone]

## 2021-01-06 NOTE — Discharge Instructions (Signed)
You were seen in the emergency department today with dental pain.  The dose of your antibiotics given on your last ED visit were low.  I am putting you on the appropriate dose of clindamycin which should help to improve your infection symptoms.  I have also called in some prescription strength ibuprofen.  I have also listed the name of the dentist on-call.  Please call the office to schedule the next available appointment.

## 2021-10-03 ENCOUNTER — Encounter (HOSPITAL_COMMUNITY): Payer: Self-pay

## 2021-10-03 ENCOUNTER — Ambulatory Visit (HOSPITAL_COMMUNITY)
Admission: EM | Admit: 2021-10-03 | Discharge: 2021-10-03 | Disposition: A | Discharge status: Home / Self Care | Payer: BC Managed Care – PPO | Attending: Internal Medicine | Admitting: Internal Medicine

## 2021-10-03 DIAGNOSIS — L0201 Cutaneous abscess of face: Secondary | ICD-10-CM

## 2021-10-03 DIAGNOSIS — R220 Localized swelling, mass and lump, head: Secondary | ICD-10-CM

## 2021-10-03 MED ORDER — IBUPROFEN 600 MG PO TABS: 600.0000 mg | ORAL_TABLET | Freq: Four times a day (QID) | ORAL | 0 refills | Status: AC | PRN

## 2021-10-03 MED ORDER — CLINDAMYCIN HCL 300 MG PO CAPS: 300.0000 mg | ORAL_CAPSULE | Freq: Three times a day (TID) | ORAL | 0 refills | Status: AC

## 2021-10-03 NOTE — Discharge Instructions (Addendum)
Take clindamycin antibiotic 3 times daily with food for the next 7 days to treat infected abscess to your face.  Apply warm compresses to the abscess frequently throughout the day to promote drainage and reduce swelling.  You may take ibuprofen 600 mg every 6 hours as needed for pain and inflammation to the abscess.   If the abscess grows in size significantly, you experience worsening swelling to the inside of your mouth, or worsening pain despite the antibiotics and ibuprofen, please return to urgent care for reevaluation of the abscess.  To find a primary care provider go to [email or web address] and follow the prompts to schedule a new patient appointment for primary care.  Someone from [facility] health will be reaching out to you either via MyChart or phone call to help you set up a primary care provider appointment as well.  It is very important to have a primary care provider to manage your overall health and prevent/manage chronic medical conditions.   If you develop any new or worsening symptoms or do not improve in the next 2 to 3 days, please return.  If your symptoms are severe, please go to the emergency room.  Follow-up with your primary care provider for further evaluation and management of your symptoms as well as ongoing wellness visits.  I hope you feel better!

## 2021-10-03 NOTE — ED Provider Notes (Signed)
[facility]    CSN: [id number] Arrival date & time: 10/03/21  1007      History   Chief Complaint Chief Complaint  Patient presents with   Mass    HPI Richard Tate is a 36 y.o. male.   Patient presents urgent care for evaluation of left sided facial abscess that started 2 days ago.  Patient initially thought that he was experiencing some tooth pain to the left side of his mouth and then realized that there was an abscess forming to the left side of his face on the outside.  This is never happened in the past.  No recent dental work, fever/chills, neck pain, blurry vision, URI symptoms, nausea, vomiting, or difficulty swallowing.  He has not shaved his beard recently and denies known infectious source.  Pain to the site is present with palpation but patient denies pain at this time without pressing on the abscess.  He states that the abscess is grown significantly in size and tenderness over the last couple of days.  No shortness of breath or difficulty swallowing reported.  He has not attempted use of any over-the-counter medications prior to arrival urgent care for symptoms.     History reviewed. No pertinent past medical history.  There are no problems to display for this patient.   History reviewed. No pertinent surgical history.     Home Medications    Prior to Admission medications   Medication Sig Start Date End Date Taking? Authorizing Provider  clindamycin (CLEOCIN) 300 MG capsule Take 1 capsule (300 mg total) by mouth 3 (three) times daily for 7 days. 10/03/21 10/10/21 Yes Oden Lindaman, Donavan Burnet, FNP  ibuprofen (ADVIL) 600 MG tablet Take 1 tablet (600 mg total) by mouth every 6 (six) hours as needed. 10/03/21  Yes Carlisle Beers, FNP  cephALEXin (KEFLEX) 500 MG capsule Take 1 capsule (500 mg total) by mouth 4 (four) times daily. 11/01/20   Marita Kansas, PA-C  oxyCODONE-acetaminophen (PERCOCET/ROXICET) 5-325 MG tablet Take 1 tablet by  mouth every 6 (six) hours as needed for severe pain. 05/30/18   Hedges, Tinnie Gens, PA-C  phenazopyridine (PYRIDIUM) 200 MG tablet Take 1 tablet (200 mg total) by mouth 3 (three) times daily. Patient not taking: Reported on 05/05/2014 01/09/14   Harle Battiest, NP    Family History History reviewed. No pertinent family history.  Social History Social History   Tobacco Use   Smoking status: Every Day   Smokeless tobacco: Never  Vaping Use   Vaping Use: Never used  Substance Use Topics   Alcohol use: Yes   Drug use: No     Allergies   Penicillins   Review of Systems Review of Systems Per HPI  Physical Exam Triage Vital Signs ED Triage Vitals  Enc Vitals Group     BP 10/03/21 1108 (!) 125/92     Pulse Rate 10/03/21 1108 79     Resp 10/03/21 1108 16     Temp 10/03/21 1108 98 F (36.7 C)     Temp Source 10/03/21 1108 Oral     SpO2 10/03/21 1108 100 %     Weight --      Height --      Head Circumference --      Peak Flow --      Pain Score 10/03/21 1109 0     Pain Loc --      Pain Edu? --      Excl. in GC? --  No data found.  Updated Vital Signs BP (!) 125/92 (BP Location: Left Arm)   Pulse 79   Temp 98 F (36.7 C) (Oral)   Resp 16   SpO2 100%   Visual Acuity Right Eye Distance:   Left Eye Distance:   Bilateral Distance:    Right Eye Near:   Left Eye Near:    Bilateral Near:     Physical Exam Vitals and nursing note reviewed.  Constitutional:      Appearance: He is not ill-appearing or toxic-appearing.  HENT:     Head: Normocephalic and atraumatic.     Jaw: There is normal jaw occlusion.      Comments: Abscess present to the left jaw line that is approximately 2 cm in diameter and fluctuant.  No significant erythema or warmth.     Right Ear: Hearing, tympanic membrane, ear canal and external ear normal.     Left Ear: Hearing, tympanic membrane, ear canal and external ear normal.     Nose: Nose normal.     Mouth/Throat:     Lips: Pink.      Mouth: Mucous membranes are moist. No injury.     Pharynx: Oropharynx is clear. Uvula midline. No posterior oropharyngeal erythema.     Comments: Gingival swelling present to the left side of the mouth without drainage or obvious dental infection.  No injury or laceration present to the oral mucosa. Eyes:     General: Lids are normal. Vision grossly intact. Gaze aligned appropriately.     Extraocular Movements: Extraocular movements intact.     Conjunctiva/sclera: Conjunctivae normal.  Pulmonary:     Effort: Pulmonary effort is normal.  Musculoskeletal:     Cervical back: Neck supple.  Skin:    General: Skin is warm and dry.     Capillary Refill: Capillary refill takes less than 2 seconds.     Findings: No rash.  Neurological:     General: No focal deficit present.     Mental Status: He is alert and oriented to person, place, and time. Mental status is at baseline.     Cranial Nerves: No dysarthria or facial asymmetry.  Psychiatric:        Mood and Affect: Mood normal.        Speech: Speech normal.        Behavior: Behavior normal.        Thought Content: Thought content normal.        Judgment: Judgment normal.           UC Treatments / Results  Labs (all labs ordered are listed, but only abnormal results are displayed) Labs Reviewed - No data to display  EKG   Radiology No results found.  Procedures Procedures (including critical care time)  Medications Ordered in UC Medications - No data to display  Initial Impression / Assessment and Plan / UC Course  I have reviewed the triage vital signs and the nursing notes.  Pertinent labs & imaging results that were available during my care of the patient were reviewed by me and considered in my medical decision making (see chart for details).   1.  Facial abscess and gingival swelling Unsure of facial abscess is related to a dental infection or if this is a unrelated skin and soft tissue infection.  We will defer  incision and drainage today and use clindamycin to treat for infected abscess.  Patient to take clindamycin 3 times daily for the next 7 days to treat  infection.  Advised to use probiotic while taking this antibiotic due to increased risk for GI side effects and diarrhea.  Advised warm compresses frequently to help abscess drain.  Ibuprofen 600 mg may be used every 6 hours as needed for pain and inflammation.  He is well-appearing with minimal pain and vital signs are hemodynamically stable in clinic at this time.  Patient expresses agreement and understanding of plan.   Discussed physical exam and available lab work findings in clinic with patient.  Counseled patient regarding appropriate use of medications and potential side effects for all medications recommended or prescribed today. Discussed red flag signs and symptoms of worsening condition,when to call the PCP office, return to urgent care, and when to seek higher level of care in the emergency department. Patient verbalizes understanding and agreement with plan. All questions answered. Patient discharged in stable condition.    Final Clinical Impressions(s) / UC Diagnoses   Final diagnoses:  Facial abscess  Gingival swelling     Discharge Instructions      Take clindamycin antibiotic 3 times daily with food for the next 7 days to treat infected abscess to your face.  Apply warm compresses to the abscess frequently throughout the day to promote drainage and reduce swelling.  You may take ibuprofen 600 mg every 6 hours as needed for pain and inflammation to the abscess.   If the abscess grows in size significantly, you experience worsening swelling to the inside of your mouth, or worsening pain despite the antibiotics and ibuprofen, please return to urgent care for reevaluation of the abscess.  To find a primary care provider go to [email or web address] and follow the prompts to schedule a new patient appointment for primary  care.  Someone from [facility] will be reaching out to you either via MyChart or phone call to help you set up a primary care provider appointment as well.  It is very important to have a primary care provider to manage your overall health and prevent/manage chronic medical conditions.   If you develop any new or worsening symptoms or do not improve in the next 2 to 3 days, please return.  If your symptoms are severe, please go to the emergency room.  Follow-up with your primary care provider for further evaluation and management of your symptoms as well as ongoing wellness visits.  I hope you feel better!     ED Prescriptions     Medication Sig Dispense Auth. Provider   clindamycin (CLEOCIN) 300 MG capsule Take 1 capsule (300 mg total) by mouth 3 (three) times daily for 7 days. 21 capsule Joella Prince M, FNP   ibuprofen (ADVIL) 600 MG tablet Take 1 tablet (600 mg total) by mouth every 6 (six) hours as needed. 30 tablet Talbot Grumbling, FNP      PDMP not reviewed this encounter.   Talbot Grumbling, [place] 10/03/21 1208
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
## 2021-12-19 ENCOUNTER — Emergency Department (HOSPITAL_COMMUNITY)
Admission: EM | Admit: 2021-12-19 | Discharge: 2021-12-19 | Discharge status: Against Medical Advice | Payer: BC Managed Care – PPO | Attending: Emergency Medicine | Admitting: Emergency Medicine

## 2021-12-19 DIAGNOSIS — Z5321 Procedure and treatment not carried out due to patient leaving prior to being seen by health care provider: Secondary | ICD-10-CM | POA: Insufficient documentation

## 2021-12-19 NOTE — ED Notes (Signed)
Pt called multiple times no answer 

## 2023-12-30 ENCOUNTER — Other Ambulatory Visit: Payer: Self-pay

## 2023-12-30 ENCOUNTER — Encounter (HOSPITAL_COMMUNITY): Payer: Self-pay | Admitting: Emergency Medicine

## 2023-12-30 ENCOUNTER — Emergency Department (HOSPITAL_COMMUNITY)

## 2023-12-30 ENCOUNTER — Emergency Department (HOSPITAL_COMMUNITY)
Admission: EM | Admit: 2023-12-30 | Discharge: 2023-12-30 | Attending: Emergency Medicine | Admitting: Emergency Medicine

## 2023-12-30 DIAGNOSIS — S0990XA Unspecified injury of head, initial encounter: Secondary | ICD-10-CM | POA: Diagnosis present

## 2023-12-30 DIAGNOSIS — H1131 Conjunctival hemorrhage, right eye: Secondary | ICD-10-CM | POA: Diagnosis not present

## 2023-12-30 DIAGNOSIS — J0110 Acute frontal sinusitis, unspecified: Secondary | ICD-10-CM | POA: Diagnosis not present

## 2023-12-30 DIAGNOSIS — S0083XA Contusion of other part of head, initial encounter: Secondary | ICD-10-CM | POA: Insufficient documentation

## 2023-12-30 MED ORDER — CLINDAMYCIN HCL 150 MG PO CAPS: 150.0000 mg | ORAL_CAPSULE | Freq: Four times a day (QID) | ORAL | 0 refills | Status: AC

## 2023-12-30 NOTE — Discharge Instructions (Addendum)
 Please use Tylenol  or ibuprofen  for pain.  You may use 600 mg ibuprofen  every 6 hours or 1000 mg of Tylenol  every 6 hours.  You may choose to alternate between the 2.  This would be most effective.  Not to exceed 4 g of Tylenol  within 24 hours.  Not to exceed 3200 mg ibuprofen  24 hours.  You can apply ice directly over the eye to help with swelling, pain.  Take the entire course of antibiotics we have prescribed to cover for dental/sinus infection.
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
